# Patient Record
Sex: Female | Born: 1995
Health system: Southern US, Community
[De-identification: ages and names within clinical notes are randomized; demographics above are authoritative.]

## PROBLEM LIST (undated history)

## (undated) DIAGNOSIS — F32A Depression, unspecified: Secondary | ICD-10-CM

## (undated) DIAGNOSIS — F329 Major depressive disorder, single episode, unspecified: Secondary | ICD-10-CM

---
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
## 2017-06-26 DIAGNOSIS — Z3046 Encounter for surveillance of implantable subdermal contraceptive: Secondary | ICD-10-CM | POA: Diagnosis not present

## 2017-06-26 DIAGNOSIS — Z308 Encounter for other contraceptive management: Secondary | ICD-10-CM | POA: Diagnosis not present

## 2018-06-17 ENCOUNTER — Other Ambulatory Visit: Payer: Self-pay

## 2018-06-17 ENCOUNTER — Ambulatory Visit: Payer: 59 | Admitting: Family Medicine

## 2018-06-17 ENCOUNTER — Encounter: Payer: Self-pay | Admitting: Family Medicine

## 2018-06-17 VITALS — BP 115/71 | HR 57 | Temp 98.5°F | Ht 66.0 in | Wt 177.0 lb

## 2018-06-17 DIAGNOSIS — F419 Anxiety disorder, unspecified: Secondary | ICD-10-CM | POA: Diagnosis not present

## 2018-06-17 DIAGNOSIS — Z7689 Persons encountering health services in other specified circumstances: Secondary | ICD-10-CM | POA: Diagnosis not present

## 2018-06-17 DIAGNOSIS — F339 Major depressive disorder, recurrent, unspecified: Secondary | ICD-10-CM | POA: Diagnosis not present

## 2018-06-17 MED ORDER — VENLAFAXINE HCL ER 37.5 MG PO CP24: 37.5000 mg | ORAL_CAPSULE | Freq: Every day | ORAL | 0 refills | Status: DC

## 2018-06-17 NOTE — Assessment & Plan Note (Signed)
Will start effexor and follow up in 2 weeks. Re-establish with counseling.

## 2018-06-17 NOTE — Assessment & Plan Note (Signed)
Possible bipolar component, will start effexor and add mood stabilizer in 2 weeks if tolerating well. Pt plans to reach back out to [facility] to re-establish with counseling. Resources given for walk in Psychiatric clinics and ER if sxs worsen significantly.

## 2018-06-17 NOTE — Patient Instructions (Signed)
Venlafaxine extended-release capsules  What is this medicine?  VENLAFAXINE(VEN la fax een) is used to treat depression, anxiety and panic disorder.  This medicine may be used for other purposes; ask your health care provider or pharmacist if you have questions.  COMMON BRAND NAME(S): Effexor XR  What should I tell my health care provider before I take this medicine?  They need to know if you have any of these conditions:  -bleeding disorders  -glaucoma  -heart disease  -high blood pressure  -high cholesterol  -kidney disease  -liver disease  -low levels of sodium in the blood  -mania or bipolar disorder  -seizures  -suicidal thoughts, plans, or attempt; a previous suicide attempt by you or a family  -take medicines that treat or prevent blood clots  -thyroid disease  -an unusual or allergic reaction to venlafaxine, desvenlafaxine, other medicines, foods, dyes, or preservatives  -pregnant or trying to get pregnant  -breast-feeding  How should I use this medicine?  Take this medicine by mouth with a full glass of water. Follow the directions on the prescription label. Do not cut, crush, or chew this medicine. Take it with food. If needed, the capsule may be carefully opened and the entire contents sprinkled on a spoonful of cool applesauce. Swallow the applesauce/pellet mixture right away without chewing and follow with a glass of water to ensure complete swallowing of the pellets. Try to take your medicine at about the same time each day. Do not take your medicine more often than directed. Do not stop taking this medicine suddenly except upon the advice of your doctor. Stopping this medicine too quickly may cause serious side effects or your condition may worsen.  A special MedGuide will be given to you by the pharmacist with each prescription and refill. Be sure to read this information carefully each time.  Talk to your pediatrician regarding the use of this medicine in children. Special care may be  needed.  Overdosage: If you think you have taken too much of this medicine contact a poison control center or emergency room at once.  NOTE: This medicine is only for you. Do not share this medicine with others.  What if I miss a dose?  If you miss a dose, take it as soon as you can. If it is almost time for your next dose, take only that dose. Do not take double or extra doses.  What may interact with this medicine?  Do not take this medicine with any of the following medications:  -certain medicines for fungal infections like fluconazole, itraconazole, ketoconazole, posaconazole, voriconazole  -cisapride  -desvenlafaxine  -dofetilide  -dronedarone  -duloxetine  -levomilnacipran  -linezolid  -MAOIs like Carbex, Eldepryl, Marplan, Nardil, and Parnate  -methylene blue (injected into a vein)  -milnacipran  -pimozide  -thioridazine  -ziprasidone  This medicine may also interact with the following medications:  -amphetamines  -aspirin and aspirin-like medicines  -certain medicines for depression, anxiety, or psychotic disturbances  -certain medicines for migraine headaches like almotriptan, eletriptan, frovatriptan, naratriptan, rizatriptan, sumatriptan, zolmitriptan  -certain medicines for sleep  -certain medicines that treat or prevent blood clots like dalteparin, enoxaparin, warfarin  -cimetidine  -clozapine  -diuretics  -fentanyl  -furazolidone  -indinavir  -isoniazid  -lithium  -metoprolol  -NSAIDS, medicines for pain and inflammation, like ibuprofen or naproxen  -other medicines that prolong the QT interval (cause an abnormal heart rhythm)  -procarbazine  -rasagiline  -supplements like St. John's wort, kava kava, valerian  -tramadol  -tryptophan    This list may not describe all possible interactions. Give your health care provider a list of all the medicines, herbs, non-prescription drugs, or dietary supplements you use. Also tell them if you smoke, drink alcohol, or use illegal drugs. Some items may interact with  your medicine.  What should I watch for while using this medicine?  Tell your doctor if your symptoms do not get better or if they get worse. Visit your doctor or health care professional for regular checks on your progress. Because it may take several weeks to see the full effects of this medicine, it is important to continue your treatment as prescribed by your doctor.  Patients and their families should watch out for new or worsening thoughts of suicide or depression. Also watch out for sudden changes in feelings such as feeling anxious, agitated, panicky, irritable, hostile, aggressive, impulsive, severely restless, overly excited and hyperactive, or not being able to sleep. If this happens, especially at the beginning of treatment or after a change in dose, call your health care professional.  This medicine can cause an increase in blood pressure. Check with your doctor for instructions on monitoring your blood pressure while taking this medicine.  You may get drowsy or dizzy. Do not drive, use machinery, or do anything that needs mental alertness until you know how this medicine affects you. Do not stand or sit up quickly, especially if you are an older patient. This reduces the risk of dizzy or fainting spells. Alcohol may interfere with the effect of this medicine. Avoid alcoholic drinks.  Your mouth may get dry. Chewing sugarless gum, sucking hard candy and drinking plenty of water will help. Contact your doctor if the problem does not go away or is severe.  What side effects may I notice from receiving this medicine?  Side effects that you should report to your doctor or health care professional as soon as possible:  -allergic reactions like skin rash, itching or hives, swelling of the face, lips, or tongue  -anxious  -breathing problems  -confusion  -changes in vision  -chest pain  -confusion  -elevated mood, decreased need for sleep, racing thoughts, impulsive behavior  -eye pain  -fast, irregular  heartbeat  -feeling faint or lightheaded, falls  -feeling agitated, angry, or irritable  -hallucination, loss of contact with reality  -high blood pressure  -loss of balance or coordination  -palpitations  -redness, blistering, peeling or loosening of the skin, including inside the mouth  -restlessness, pacing, inability to keep still  -seizures  -stiff muscles  -suicidal thoughts or other mood changes  -trouble passing urine or change in the amount of urine  -trouble sleeping  -unusual bleeding or bruising  -unusually weak or tired  -vomiting  Side effects that usually do not require medical attention (report to your doctor or health care professional if they continue or are bothersome):  -change in sex drive or performance  -change in appetite or weight  -constipation  -dizziness  -dry mouth  -headache  -increased sweating  -nausea  -tired  This list may not describe all possible side effects. Call your doctor for medical advice about side effects. You may report side effects to FDA at 1-800-FDA-1088.  Where should I keep my medicine?  Keep out of the reach of children.  Store at a controlled temperature between 20 and 25 degrees C (68 degrees and 77 degrees F), in a dry place. Throw away any unused medicine after the expiration date.  NOTE: This sheet is a   summary. It may not cover all possible information. If you have questions about this medicine, talk to your doctor, pharmacist, or health care provider.   2019 Elsevier/Gold Standard (2015-08-24 18:38:02)

## 2018-06-17 NOTE — Progress Notes (Signed)
   BP 115/71   Pulse (!) 57   Temp 98.5 F (36.9 C) (Oral)   Ht 5\' 6"  (1.676 m)   Wt 177 lb (80.3 kg)   SpO2 98%   BMI 28.57 kg/m    Subjective:    Patient ID: Brittany Vega, female    DOB: 1995-10-30, 22 y.o.   MRN: [id number]  HPI: Brittany Vega is a 23 y.o. female  Chief Complaint  Patient presents with  . New Patient (Initial Visit)    Preferred Primary Care   Here today to establish care. Has not had a CPE in over a year. Currently has nexplanon for contraception.   Feels very overwhelmed, anxious since about age 67. Has been to [facility] for counseling twice which didn't seem to be a good fit for her. Thinks maybe some childhood issues are at the root but nothing in particular. Symptoms are every day and she doesn't necessarily notice triggers. Sweats, hot, chest tightness, nausea, mind racing and can happen at any time. Struggles to fall asleep and stay asleep and then oversleeps. Also dealing with depressed moods frequently but not all the time. Has really high highs and low lows frequently. Fhx of depression and anxiety. Denies SI/HI.   No other known medical issues or concerns today.   No flowsheet data found. No flowsheet data found.     Relevant past medical, surgical, family and social history reviewed and updated as indicated. Interim medical history since our last visit reviewed. Allergies and medications reviewed and updated.  Review of Systems  Per HPI unless specifically indicated above     Objective:    BP 115/71   Pulse (!) 57   Temp 98.5 F (36.9 C) (Oral)   Ht 5\' 6"  (1.676 m)   Wt 177 lb (80.3 kg)   SpO2 98%   BMI 28.57 kg/m   Wt Readings from Last 3 Encounters:  06/17/18 177 lb (80.3 kg)  12/05/13 135 lb (61.2 kg) (74 %, Z= 0.64)*   * Growth percentiles are based on CDC (Girls, 2-20 Years) data.    Physical Exam  No results found for this or any previous visit.    Assessment & Plan:   Problem List Items Addressed This Visit      Other   Anxiety - Primary    Will start effexor and follow up in 2 weeks. Re-establish with counseling.       Relevant Medications   venlafaxine XR (EFFEXOR XR) 37.5 MG 24 hr capsule   Depression, recurrent (HCC)    Possible bipolar component, will start effexor and add mood stabilizer in 2 weeks if tolerating well. Pt plans to reach back out to [facility] to re-establish with counseling. Resources given for walk in Psychiatric clinics and ER if sxs worsen significantly.       Relevant Medications   venlafaxine XR (EFFEXOR XR) 37.5 MG 24 hr capsule    Other Visit Diagnoses    Encounter to establish care           Follow up plan: Return in about 2 weeks (around 07/01/2018) for CPE - Mood, anxiety f/u.

## 2018-07-02 ENCOUNTER — Encounter: Payer: 59 | Admitting: Family Medicine

## 2018-07-17 ENCOUNTER — Other Ambulatory Visit: Payer: Self-pay | Admitting: Family Medicine

## 2018-07-17 NOTE — Telephone Encounter (Signed)
Pt is several weeks overdue for f/u, I will send in 2 week supply but please schedule her for a f/u ASAP

## 2018-07-17 NOTE — Telephone Encounter (Signed)
Requested medication (s) are due for refill today:yes  Requested medication (s) are on the active medication list: yes  Last refill: 06/17/2018  Future visit scheduled: no  Notes to clinic:  Over due labs    Requested Prescriptions  Pending Prescriptions Disp Refills   venlafaxine XR (EFFEXOR-XR) 37.5 MG 24 hr capsule [Pharmacy Med Name: VENLAFAXINE HCL ER 37.5 MG CAP] 30 capsule 0    Sig: TAKE 1 CAPSULE BY MOUTH EVERY DAY WITH BREAKFAST     Psychiatry: Antidepressants - SNRI - desvenlafaxine & venlafaxine Failed - 07/17/2018  1:48 PM      Failed - LDL in normal range and within 360 days    No results found for: LDLCALC, LDLC, HIRISKLDL       Failed - Total Cholesterol in normal range and within 360 days    No results found for: CHOL, POCCHOL       Failed - Triglycerides in normal range and within 360 days    No results found for: TRIG       Failed - Completed PHQ-2 or PHQ-9 in the last 360 days.      Passed - Last BP in normal range    BP Readings from Last 1 Encounters:  06/17/18 115/71         Passed - Valid encounter within last 6 months    Recent Outpatient Visits          1 month ago Anxiety   [facility] Roosvelt Maser [place], [place]

## 2018-07-17 NOTE — Telephone Encounter (Signed)
Called no answer left voicemail

## 2018-07-28 ENCOUNTER — Ambulatory Visit (INDEPENDENT_AMBULATORY_CARE_PROVIDER_SITE_OTHER): Payer: 59 | Admitting: Family Medicine

## 2018-07-28 ENCOUNTER — Other Ambulatory Visit: Payer: Self-pay

## 2018-07-28 ENCOUNTER — Encounter: Payer: Self-pay | Admitting: Family Medicine

## 2018-07-28 DIAGNOSIS — F339 Major depressive disorder, recurrent, unspecified: Secondary | ICD-10-CM

## 2018-07-28 DIAGNOSIS — F419 Anxiety disorder, unspecified: Secondary | ICD-10-CM

## 2018-07-28 MED ORDER — VENLAFAXINE HCL ER 37.5 MG PO CP24: 37.5000 mg | ORAL_CAPSULE | Freq: Every day | ORAL | 0 refills | Status: DC

## 2018-07-28 NOTE — Assessment & Plan Note (Signed)
Significant improvement with low dose effexor XR. Continue current regimen. Recheck in 3 months

## 2018-07-28 NOTE — Progress Notes (Signed)
There were no vitals taken for this visit.   Subjective:    Patient ID: Brittany Vega, female    DOB: 23-Jun-1995, 23 y.o.   MRN: [id number]  HPI: Brittany Vega is a 23 y.o. female  Chief Complaint  Patient presents with  . Follow-up  . Anxiety  . Depression    No side effects to medication. Is helping.     . This visit was completed via WebEx due to the restrictions of the COVID-19 pandemic. All issues as above were discussed and addressed. Physical exam was done as above through visual confirmation on WebEx. If it was felt that the patient should be evaluated in the office, they were directed there. The patient verbally consented to this visit. . Location of the patient: home . Location of the provider: home . Those involved with this call:  . Provider: Roosvelt Maserachel Shakema Surita, PA-C . CMA: Sheilah MinsJada Fox, CMA . Front Desk/Registration: Harriet PhoJoliza Johnson  . Time spent on call: 15 minutes with patient face to face via video conference. More than 50% of this time was spent in counseling and coordination of care. 5 minutes total spent in review of patient's record and preparation of their chart.  I verified patient identity using two factors (patient name and date of birth). Patient consents verbally to being seen via telemedicine visit today.   Pt here today for 1 month f/u mood and anxiety after starting low dose effexor. Notes significant benefit in mood stability and anxiety on the medicine. Minimal low moods or panic episodes since starting on it. Denies SI/HI. Sleeping better and not noticing any side effects. No new concerns today.   [facility] 07[facility]  Decreased Interest 0  Down, Depressed, Hopeless 0  PHQ - 2 Score 0  Altered sleeping 1  Tired, decreased energy 1  Change in appetite 0  Feeling bad or failure about yourself  0  Trouble concentrating 1  Moving slowly or fidgety/restless 0  Suicidal thoughts 0  PHQ-9 Score 3  Difficult doing work/chores Not difficult at  all   GAD 7 : Generalized Anxiety Score 07/28/2018  Nervous, Anxious, on Edge 1  Control/stop worrying 1  Worry too much - different things 1  Trouble relaxing 0  Restless 0  Easily annoyed or irritable 0  Afraid - awful might happen 0  Total GAD 7 Score 3  Anxiety Difficulty Not difficult at all   Relevant past medical, surgical, family and social history reviewed and updated as indicated. Interim medical history since our last visit reviewed. Allergies and medications reviewed and updated.  Review of Systems  Per HPI unless specifically indicated above     Objective:    There were no vitals taken for this visit.  Wt Readings from Last 3 Encounters:  06/17/18 177 lb (80.3 kg)  12/05/13 135 lb (61.2 kg) (74 %, Z= 0.64)*   * Growth percentiles are based on CDC (Girls, 2-20 Years) data.    Physical Exam Vitals signs and nursing note reviewed.  Constitutional:      General: She is not in acute distress.    Appearance: Normal appearance.  HENT:     Head: Atraumatic.     Right Ear: External ear normal.     Left Ear: External ear normal.     Mouth/Throat:     Mouth: Mucous membranes are moist.  Eyes:     Extraocular Movements: Extraocular movements intact.     Conjunctiva/sclera: Conjunctivae normal.  Neck:  Musculoskeletal: Normal range of motion.  Cardiovascular:     Comments: Unable to assess via virtual visit Pulmonary:     Effort: Pulmonary effort is normal. No respiratory distress.  Musculoskeletal: Normal range of motion.  Skin:    General: Skin is dry.     Findings: No erythema.  Neurological:     Mental Status: She is alert and oriented to person, place, and time.  Psychiatric:        Mood and Affect: Mood normal.        Thought Content: Thought content normal.        Judgment: Judgment normal.     No results found for this or any previous visit.    Assessment & Plan:   Problem List Items Addressed This Visit      Other   Anxiety     Significant improvement with low dose effexor XR. Continue current regimen. Recheck in 3 months      Relevant Medications   venlafaxine XR (EFFEXOR-XR) 37.5 MG 24 hr capsule   Depression, recurrent (HCC) - Primary    Significant improvement with low dose effexor XR. Continue current regimen. Recheck in 3 months      Relevant Medications   venlafaxine XR (EFFEXOR-XR) 37.5 MG 24 hr capsule       Follow up plan: Return in about 3 months (around 10/27/2018) for Mood f/u.

## 2018-07-28 NOTE — Assessment & Plan Note (Signed)
Significant improvement with low dose effexor XR. Continue current regimen. Recheck in 3 months 

## 2018-08-06 ENCOUNTER — Ambulatory Visit (INDEPENDENT_AMBULATORY_CARE_PROVIDER_SITE_OTHER): Payer: 59 | Admitting: Family Medicine

## 2018-08-06 ENCOUNTER — Other Ambulatory Visit: Payer: Self-pay

## 2018-08-06 ENCOUNTER — Encounter: Payer: Self-pay | Admitting: Family Medicine

## 2018-08-06 VITALS — Ht 66.0 in

## 2018-08-06 DIAGNOSIS — L509 Urticaria, unspecified: Secondary | ICD-10-CM

## 2018-08-06 MED ORDER — TRIAMCINOLONE ACETONIDE 0.1 % EX CREA: 1.0000 "application " | TOPICAL_CREAM | Freq: Two times a day (BID) | CUTANEOUS | 1 refills | Status: DC

## 2018-08-06 NOTE — Progress Notes (Signed)
Ht 5\' 6"  (1.676 m)   BMI 28.57 kg/m    Subjective:    Patient ID: Brittany Vega Raptis, female    DOB: 08/23/1995, 23 y.o.   MRN: [id number]  HPI: Brittany Vega Cavallero is a 23 y.o. female  Chief Complaint  Patient presents with  . Rash    left arm, chest, small spots on bilateral calf. x 3 weeks. red and itching. tried OTC cortisone    . This visit was completed via WebEx due to the restrictions of the COVID-19 pandemic. All issues as above were discussed and addressed. Physical exam was done as above through visual confirmation on WebEx. If it was felt that the patient should be evaluated in the office, they were directed there. The patient verbally consented to this visit. . Location of the patient: home . Location of the provider: work . Those involved with this call:  . Provider: Roosvelt Maserachel Lane, PA-C . CMA: Elton SinAnita Quito, CMA . Front Desk/Registration: Harriet PhoJoliza Johnson  . Time spent on call: 15 minutes with patient face to face via video conference. More than 50% of this time was spent in counseling and coordination of care. 5 minutes total spent in review of patient's record and preparation of their chart. I verified patient identity using two factors (patient name and date of birth). Patient consents verbally to being seen via telemedicine visit today.   Patient c/o 3 weeks of itching intermittent rash. Started on backs of thighs and has now spread several places, including left forearm and abdomen. Seems to come and go. Very itchy, does not burn. States this started about 6 years ago and just comes up occasionally. Has not had a flare in about a year. No new medicines, foods, recent travel, yardwork the past week or so. Does note her sister had a small spot on her that she thought was poison ivy but they weren't sure. Trying alcohol wipes, calamine lotion, and cortisone cream with minimal relief. Tried benadryl initially without much relief.   Relevant past medical, surgical, family and social  history reviewed and updated as indicated. Interim medical history since our last visit reviewed. Allergies and medications reviewed and updated.  Review of Systems  Per HPI unless specifically indicated above     Objective:    Ht 5\' 6"  (1.676 m)   BMI 28.57 kg/m   Wt Readings from Last 3 Encounters:  06/17/18 177 lb (80.3 kg)  12/05/13 135 lb (61.2 kg) (74 %, Z= 0.64)*   * Growth percentiles are based on CDC (Girls, 2-20 Years) data.    Physical Exam Vitals signs and nursing note reviewed.  Constitutional:      General: She is not in acute distress.    Appearance: Normal appearance.  HENT:     Head: Atraumatic.     Right Ear: External ear normal.     Left Ear: External ear normal.     Nose: Nose normal. No congestion.     Mouth/Throat:     Mouth: Mucous membranes are moist.     Pharynx: Oropharynx is clear. No posterior oropharyngeal erythema.  Eyes:     Extraocular Movements: Extraocular movements intact.     Conjunctiva/sclera: Conjunctivae normal.  Neck:     Musculoskeletal: Normal range of motion.  Cardiovascular:     Comments: Unable to assess via virtual visit Pulmonary:     Effort: Pulmonary effort is normal. No respiratory distress.  Musculoskeletal: Normal range of motion.  Skin:    General: Skin is  warm and dry.     Findings: No erythema.     Comments: Sporadic erythematous maculopapular areas on left forearm and darkened area from previous inflammatory rash on posterior upper legs  Neurological:     Mental Status: She is alert and oriented to person, place, and time.  Psychiatric:        Mood and Affect: Mood normal.        Thought Content: Thought content normal.        Judgment: Judgment normal.     No results found for this or any previous visit.    Assessment & Plan:   Problem List Items Addressed This Visit    None    Visit Diagnoses    Hives    -  Primary   Tx with antihistamines BID, triamcinolone cream. If worsening or not improving,  add prednisone taper. Unclear trigger at this time       Follow up plan: Return for as scheduled.

## 2018-08-16 ENCOUNTER — Encounter (HOSPITAL_COMMUNITY): Payer: Self-pay | Admitting: Emergency Medicine

## 2018-08-16 ENCOUNTER — Emergency Department (HOSPITAL_COMMUNITY): Payer: 59

## 2018-08-16 ENCOUNTER — Other Ambulatory Visit: Payer: Self-pay

## 2018-08-16 ENCOUNTER — Emergency Department (HOSPITAL_COMMUNITY)
Admission: EM | Admit: 2018-08-16 | Discharge: 2018-08-16 | Disposition: A | Discharge status: Home / Self Care | Payer: 59 | Attending: Emergency Medicine | Admitting: Emergency Medicine

## 2018-08-16 DIAGNOSIS — X501XXA Overexertion from prolonged static or awkward postures, initial encounter: Secondary | ICD-10-CM | POA: Insufficient documentation

## 2018-08-16 DIAGNOSIS — Z79899 Other long term (current) drug therapy: Secondary | ICD-10-CM | POA: Diagnosis not present

## 2018-08-16 DIAGNOSIS — Y999 Unspecified external cause status: Secondary | ICD-10-CM | POA: Diagnosis not present

## 2018-08-16 DIAGNOSIS — S99921A Unspecified injury of right foot, initial encounter: Secondary | ICD-10-CM | POA: Diagnosis present

## 2018-08-16 DIAGNOSIS — S92354A Nondisplaced fracture of fifth metatarsal bone, right foot, initial encounter for closed fracture: Secondary | ICD-10-CM | POA: Diagnosis not present

## 2018-08-16 DIAGNOSIS — Y929 Unspecified place or not applicable: Secondary | ICD-10-CM | POA: Insufficient documentation

## 2018-08-16 DIAGNOSIS — Y9389 Activity, other specified: Secondary | ICD-10-CM | POA: Diagnosis not present

## 2018-08-16 HISTORY — DX: Depression, unspecified: F32.A

## 2018-08-16 MED ORDER — NAPROXEN 250 MG PO TABS
500.0000 mg | ORAL_TABLET | Freq: Once | ORAL | Status: AC
  Administered 2018-08-16: 500 mg via ORAL
  Filled 2018-08-16: qty 2

## 2018-08-16 NOTE — ED Notes (Signed)
Patient transported to X-ray 

## 2018-08-16 NOTE — ED Notes (Signed)
Discharge instructions and follow up care discussed with patient and mother at bedside. Pt. Has no questions at this time.

## 2018-08-16 NOTE — Discharge Instructions (Addendum)
You may alternate ibuprofen or Tylenol for your pain, please keep your leg elevated while at home along with applying ice to the area.  I have also provided crutches a you may use to keep your weight off of your right foot.  Please follow-up with orthopedics, Dr. Aundria Rud at your earliest convenience.

## 2018-08-16 NOTE — ED Provider Notes (Signed)
[facility]   CSN: [id number] Arrival date & time: 08/16/18  2059    History   Chief Complaint Chief Complaint  Patient presents with  . Foot Pain    HPI Brittany Vega is a 23 y.o. female.     23 y.o female with a PMH of depression presents to the ED with a chief complaint of right foot pain.  Patient reports she was walking downhill with platform shoes when her shoe inverted over, she reports falling.  Patient is experiencing pain along the lateral aspect of her foot, reports she was able to ambulate after the accident happened, however now there is been increasing in swelling along the lateral region.  She reports no medical therapy or applying ice to the area prior to arrival.  Patient denies any other injury.     Past Medical History:  Diagnosis Date  . Depression     Patient Active Problem List   Diagnosis Date Noted  . Anxiety 06/17/2018  . Depression, recurrent (HCC) 06/17/2018    History reviewed. No pertinent surgical history.   OB History   No obstetric history on file.      Home Medications    Prior to Admission medications   Medication Sig Start Date End Date Taking? Authorizing Provider  etonogestrel (NEXPLANON) 68 MG IMPL implant 1 each by Subdermal route once. Placed April 2019    [provider]  triamcinolone cream (KENALOG) 0.1 % Apply 1 application topically 2 (two) times daily. 08/06/18   Particia NearingLane, Rachel Elizabeth, PA-C  venlafaxine XR (EFFEXOR-XR) 37.5 MG 24 hr capsule Take 1 capsule (37.5 mg total) by mouth daily. 07/28/18   Particia NearingLane, Rachel Elizabeth, PA-C    Family History Family History  Problem Relation Age of Onset  . Congestive Heart Failure Maternal Grandmother   . COPD Maternal Grandmother   . Fibromyalgia Maternal Grandmother     Social History Social History   Tobacco Use  . Smoking status: Never Smoker  . Smokeless tobacco: Never Used  Substance Use Topics  .  Alcohol use: No  . Drug use: No     Allergies   Patient has no known allergies.   Review of Systems Review of Systems  Constitutional: Negative for fever.  Musculoskeletal: Positive for arthralgias.     Physical Exam Updated Vital Signs BP 121/74   Pulse 72   Temp 98.7 F (37.1 C) (Oral)   Resp 17   Ht 5\' 6"  (1.676 m)   Wt 79.4 kg   SpO2 100%   BMI 28.25 kg/m   Physical Exam Vitals signs and nursing note reviewed.      ED Treatments / Results  Labs (all labs ordered are listed, but only abnormal results are displayed) Labs Reviewed - No data to display  EKG None  Radiology Dg Ankle 2 Views Right  Result Date: 08/16/2018 CLINICAL DATA:  Twisting injury EXAM: RIGHT ANKLE - 2 VIEW; RIGHT FOOT - 2 VIEW COMPARISON:  None. FINDINGS: There is a subtle, nondisplaced transverse fracture of the base of the right fifth metatarsal. No other fracture or dislocation of the right foot or ankle. Joint spaces are preserved. Soft tissue edema about the lateral foot. IMPRESSION: There is a subtle, nondisplaced transverse fracture of the base of the right fifth metatarsal. No other fracture or dislocation of the right foot or ankle. Joint spaces are preserved. Soft tissue edema about the lateral foot. Electronically Signed   By: Lauralyn PrimesAlex  Bibbey  M.D.   On: 08/16/2018 21:42   Dg Foot 2 Views Right  Result Date: 08/16/2018 CLINICAL DATA:  Twisting injury EXAM: RIGHT ANKLE - 2 VIEW; RIGHT FOOT - 2 VIEW COMPARISON:  None. FINDINGS: There is a subtle, nondisplaced transverse fracture of the base of the right fifth metatarsal. No other fracture or dislocation of the right foot or ankle. Joint spaces are preserved. Soft tissue edema about the lateral foot. IMPRESSION: There is a subtle, nondisplaced transverse fracture of the base of the right fifth metatarsal. No other fracture or dislocation of the right foot or ankle. Joint spaces are preserved. Soft tissue edema about the lateral foot.  Electronically Signed   By: Lauralyn Primes M.D.   On: 08/16/2018 21:42    Procedures Procedures (including critical care time)  Medications Ordered in ED Medications  naproxen (NAPROSYN) tablet 500 mg (500 mg Oral Given 08/16/18 2155)     Initial Impression / Assessment and Plan / ED Course  I have reviewed the triage vital signs and the nursing notes.  Pertinent labs & imaging results that were available during my care of the patient were reviewed by me and considered in my medical decision making (see chart for details).       Patient with no past medical history presents to the ED status post fall this evening.  Reports pain along the lateral aspect of her right foot, reports ambulating after the incident but noticing increasing swelling.  There is tenderness to palpation along the lateral region.  During evaluation patient is neurovascularly intact, is able to plantarflex along with dorsiflex.  No abrasion noted, there is swelling to the lateral aspect of the foot.  X-rays of the ankle and right foot have been obtained which showed: There is a subtle, nondisplaced transverse fracture of the base of  the right fifth metatarsal. No other fracture or dislocation of the  right foot or ankle. Joint spaces are preserved. Soft tissue edema  about the lateral foot.        These results have been discussed with patient, I will place patient on a postop shoe along with provide her with crutches and orthopedic referral.  Rice therapy is also recommended for patient.  She received naproxen while in the ED.  Patient will be discharged with postop shoe along with crutches to help with ambulation.  Patient understands and agrees with management, with stable vital signs return precautions provided at length.  Portions of this note were generated with Scientist, clinical (histocompatibility and immunogenetics). Dictation errors may occur despite best attempts at proofreading.     Final Clinical Impressions(s) / ED Diagnoses    Final diagnoses:  Nondisplaced fracture of fifth metatarsal bone, right foot, initial encounter for closed fracture    ED Discharge Orders    None       Claude Manges, Cordelia Poche 08/16/18 2203    Tegeler, Canary Brim, MD 08/17/18 [phone]

## 2018-08-16 NOTE — ED Triage Notes (Signed)
Reports twisting right ankle while wearing platform shoes.  Now has pain and swelling to outer right foot.

## 2018-08-26 DIAGNOSIS — S92354A Nondisplaced fracture of fifth metatarsal bone, right foot, initial encounter for closed fracture: Secondary | ICD-10-CM | POA: Diagnosis not present

## 2018-08-26 DIAGNOSIS — M79671 Pain in right foot: Secondary | ICD-10-CM | POA: Diagnosis not present

## 2018-10-30 ENCOUNTER — Encounter: Payer: Self-pay | Admitting: Family Medicine

## 2018-10-30 ENCOUNTER — Other Ambulatory Visit: Payer: Self-pay

## 2018-10-30 ENCOUNTER — Ambulatory Visit (INDEPENDENT_AMBULATORY_CARE_PROVIDER_SITE_OTHER): Payer: 59 | Admitting: Family Medicine

## 2018-10-30 VITALS — BP 120/78 | HR 84 | Temp 98.4°F | Ht 66.0 in | Wt 185.0 lb

## 2018-10-30 DIAGNOSIS — F339 Major depressive disorder, recurrent, unspecified: Secondary | ICD-10-CM | POA: Diagnosis not present

## 2018-10-30 DIAGNOSIS — R300 Dysuria: Secondary | ICD-10-CM

## 2018-10-30 DIAGNOSIS — F419 Anxiety disorder, unspecified: Secondary | ICD-10-CM

## 2018-10-30 LAB — UA/M W/RFLX CULTURE, ROUTINE
Bilirubin, UA: NEGATIVE
Glucose, UA: NEGATIVE
Ketones, UA: NEGATIVE
Leukocytes,UA: NEGATIVE
Nitrite, UA: NEGATIVE
Protein,UA: NEGATIVE
Specific Gravity, UA: 1.02 (ref 1.005–1.030)
Urobilinogen, Ur: 1 mg/dL (ref 0.2–1.0)
pH, UA: 8.5 — ABNORMAL HIGH (ref 5.0–7.5)

## 2018-10-30 LAB — MICROSCOPIC EXAMINATION

## 2018-10-30 MED ORDER — VENLAFAXINE HCL ER 75 MG PO CP24: 75.0000 mg | ORAL_CAPSULE | Freq: Every day | ORAL | 0 refills | Status: DC

## 2018-10-30 NOTE — Progress Notes (Signed)
BP 120/78   Pulse 84   Temp 98.4 F (36.9 C) (Oral)   Ht 5\' 6"  (1.676 m)   Wt 185 lb (83.9 kg)   SpO2 99%   BMI 29.86 kg/m    Subjective:    Patient ID: Brittany HerterHayley B Lacey, female    DOB: Oct 28, 1995, 23 y.o.   MRN: [id number]  HPI: Brittany Vega is a 23 y.o. female  Chief Complaint  Patient presents with  . Anxiety    f/u  . Depression   Patient presenting today for anxiety and depression f/u. Currently on low dose effexor x 4 months, feels it's helping quite a bit and no longer having all the panic attacks or low moods but does still have a fair amount of anxiety. Denies side effects, SI/HI.   6 days of dysuria, drinking tons of water which has helped. Hx of UTIs. Denies fevers, chills, abdominal pain, hematuria, flank pain, concern for STIs.   Relevant past medical, surgical, family and social history reviewed and updated as indicated. Interim medical history since our last visit reviewed. Allergies and medications reviewed and updated.  Review of Systems  Per HPI unless specifically indicated above     Objective:    BP 120/78   Pulse 84   Temp 98.4 F (36.9 C) (Oral)   Ht 5\' 6"  (1.676 m)   Wt 185 lb (83.9 kg)   SpO2 99%   BMI 29.86 kg/m   Wt Readings from Last 3 Encounters:  10/30/18 185 lb (83.9 kg)  08/16/18 175 lb (79.4 kg)  06/17/18 177 lb (80.3 kg)    Physical Exam Vitals signs and nursing note reviewed.  Constitutional:      Appearance: Normal appearance. She is not ill-appearing.  HENT:     Head: Atraumatic.  Eyes:     Extraocular Movements: Extraocular movements intact.     Conjunctiva/sclera: Conjunctivae normal.  Neck:     Musculoskeletal: Normal range of motion and neck supple.  Cardiovascular:     Rate and Rhythm: Normal rate and regular rhythm.     Heart sounds: Normal heart sounds.  Pulmonary:     Effort: Pulmonary effort is normal.     Breath sounds: Normal breath sounds.  Abdominal:     General: Bowel sounds are normal.   Palpations: Abdomen is soft.     Tenderness: There is no abdominal tenderness. There is no right CVA tenderness, left CVA tenderness or guarding.  Musculoskeletal: Normal range of motion.  Skin:    General: Skin is warm and dry.  Neurological:     Mental Status: She is alert and oriented to person, place, and time.  Psychiatric:        Mood and Affect: Mood normal.        Thought Content: Thought content normal.        Judgment: Judgment normal.     Results for orders placed or performed in visit on 10/30/18  Microscopic Examination   URINE  Result Value Ref Range   WBC, UA 0-5 0 - 5 /hpf   RBC 0-2 0 - 2 /hpf   Epithelial Cells (non renal) 0-10 0 - 10 /hpf   Bacteria, UA Few (A) None seen/Few  UA/M w/rflx Culture, Routine   Specimen: Urine   URINE  Result Value Ref Range   Specific Gravity, UA 1.020 1.005 - 1.030   pH, UA 8.5 (H) 5.0 - 7.5   Color, UA Yellow Yellow   Appearance Ur Clear Clear  Leukocytes,UA Negative Negative   Protein,UA Negative Negative/Trace   Glucose, UA Negative Negative   Ketones, UA Negative Negative   RBC, UA 3+ (A) Negative   Bilirubin, UA Negative Negative   Urobilinogen, Ur 1.0 0.2 - 1.0 mg/dL   Nitrite, UA Negative Negative   Microscopic Examination See below:       Assessment & Plan:   Problem List Items Addressed This Visit      Other   Anxiety - Primary    Increase effexor to 75 mg XR dose and monitor for benefit. Continue working on breathing techniques, exercise      Relevant Medications   venlafaxine XR (EFFEXOR XR) 75 MG 24 hr capsule   Depression, recurrent (HCC)    Improved on effexor, still with some breakthrough sxs. Increase dose to effexor 75 XR and monitor for benefit.       Relevant Medications   venlafaxine XR (EFFEXOR XR) 75 MG 24 hr capsule    Other Visit Diagnoses    Dysuria       U/A without evidence of UTI, continue drinking plenty of fluids and f/u if sxs worsening or not improving   Relevant Orders    UA/M w/rflx Culture, Routine (Completed)       Follow up plan: Return in about 6 months (around 05/02/2019) for Mood f/u.

## 2018-11-02 NOTE — Assessment & Plan Note (Signed)
Improved on effexor, still with some breakthrough sxs. Increase dose to effexor 75 XR and monitor for benefit.

## 2018-11-02 NOTE — Assessment & Plan Note (Signed)
Increase effexor to 75 mg XR dose and monitor for benefit. Continue working on breathing techniques, exercise

## 2018-11-04 ENCOUNTER — Encounter: Payer: Self-pay | Admitting: Family Medicine

## 2018-11-05 ENCOUNTER — Other Ambulatory Visit: Payer: Self-pay | Admitting: Family Medicine

## 2018-11-05 DIAGNOSIS — R300 Dysuria: Secondary | ICD-10-CM

## 2018-11-06 ENCOUNTER — Other Ambulatory Visit: Payer: Self-pay

## 2018-11-06 ENCOUNTER — Other Ambulatory Visit: Payer: 59

## 2018-11-06 DIAGNOSIS — R300 Dysuria: Secondary | ICD-10-CM

## 2018-11-08 LAB — UA/M W/RFLX CULTURE, ROUTINE
Bilirubin, UA: NEGATIVE
Glucose, UA: NEGATIVE
Leukocytes,UA: NEGATIVE
Nitrite, UA: POSITIVE — AB
RBC, UA: NEGATIVE
Specific Gravity, UA: 1.025 (ref 1.005–1.030)
Urobilinogen, Ur: 0.2 mg/dL (ref 0.2–1.0)
pH, UA: 5.5 (ref 5.0–7.5)

## 2018-11-08 LAB — MICROSCOPIC EXAMINATION: RBC: NONE SEEN /hpf (ref 0–2)

## 2018-11-08 LAB — URINE CULTURE, REFLEX

## 2018-11-09 ENCOUNTER — Other Ambulatory Visit: Payer: Self-pay | Admitting: Family Medicine

## 2018-11-09 MED ORDER — NITROFURANTOIN MONOHYD MACRO 100 MG PO CAPS: 100.0000 mg | ORAL_CAPSULE | Freq: Two times a day (BID) | ORAL | 0 refills | Status: DC

## 2018-12-02 ENCOUNTER — Encounter: Payer: Self-pay | Admitting: Family Medicine

## 2018-12-03 ENCOUNTER — Ambulatory Visit (INDEPENDENT_AMBULATORY_CARE_PROVIDER_SITE_OTHER): Payer: 59 | Admitting: Family Medicine

## 2018-12-03 ENCOUNTER — Encounter: Payer: Self-pay | Admitting: Family Medicine

## 2018-12-03 ENCOUNTER — Other Ambulatory Visit: Payer: Self-pay

## 2018-12-03 VITALS — BP 123/72 | HR 80 | Temp 99.3°F | Ht 66.0 in | Wt 182.0 lb

## 2018-12-03 DIAGNOSIS — S01331A Puncture wound without foreign body of right ear, initial encounter: Secondary | ICD-10-CM

## 2018-12-03 DIAGNOSIS — F419 Anxiety disorder, unspecified: Secondary | ICD-10-CM

## 2018-12-03 DIAGNOSIS — F339 Major depressive disorder, recurrent, unspecified: Secondary | ICD-10-CM

## 2018-12-03 MED ORDER — QUETIAPINE FUMARATE 50 MG PO TABS: 50.0000 mg | ORAL_TABLET | Freq: Every day | ORAL | 0 refills | Status: DC

## 2018-12-03 MED ORDER — VENLAFAXINE HCL ER 75 MG PO CP24: 75.0000 mg | ORAL_CAPSULE | Freq: Every day | ORAL | 0 refills | Status: DC

## 2018-12-03 MED ORDER — SULFAMETHOXAZOLE-TRIMETHOPRIM 800-160 MG PO TABS: 1.0000 | ORAL_TABLET | Freq: Two times a day (BID) | ORAL | 0 refills | Status: DC

## 2018-12-03 NOTE — Progress Notes (Signed)
BP 123/72   Pulse 80   Temp 99.3 F (37.4 C) (Oral)   Ht 5\' 6"  (1.676 m)   Wt 182 lb (82.6 kg)   SpO2 98%   BMI 29.38 kg/m    Subjective:    Patient ID: Brittany Vega, female    DOB: 06/25/1995, 23 y.o.   MRN: [id number]  HPI: Brittany Vega is a 23 y.o. female  Chief Complaint  Patient presents with  . Ear Pain    swelling, redness right earlobe and underneath, first started this past Monday morning and progressively getting worse   Here today for swollen, painful right earlobe x 4 days where her stretch gage piercing is. States this happens from time to time since she got the gages. Has been using neosporin which does seem to help a bit but now feeling like her lymph nodes on that side of her neck are feeling swollen some too.   Recently increased the effexor to 75 mg, noticed it helped a lot initially but now back to feeling depressed, feeling her moods are labile, and having trouble sleeping. Denies SI/HI. Anxiety is also up, hands sweating and feels her heart races at times.   Depression screen [facility] 2/9 10/30/2018 07/28/2018  Decreased Interest 1 0  Down, Depressed, Hopeless 1 0  PHQ - 2 Score 2 0  Altered sleeping 1 1  Tired, decreased energy 1 1  Change in appetite 0 0  Feeling bad or failure about yourself  0 0  Trouble concentrating 1 1  Moving slowly or fidgety/restless 0 0  Suicidal thoughts 0 0  PHQ-9 Score 5 3  Difficult doing work/chores - Not difficult at all   GAD 7 : Generalized Anxiety Score 10/30/2018 07/28/2018  Nervous, Anxious, on Edge 1 1  Control/stop worrying 0 1  Worry too much - different things 1 1  Trouble relaxing 1 0  Restless 0 0  Easily annoyed or irritable 0 0  Afraid - awful might happen 0 0  Total GAD 7 Score 3 3  Anxiety Difficulty Somewhat difficult Not difficult at all   Relevant past medical, surgical, family and social history reviewed and updated as indicated. Interim medical history since our last visit reviewed. Allergies  and medications reviewed and updated.  Review of Systems  Per HPI unless specifically indicated above     Objective:    BP 123/72   Pulse 80   Temp 99.3 F (37.4 C) (Oral)   Ht 5\' 6"  (1.676 m)   Wt 182 lb (82.6 kg)   SpO2 98%   BMI 29.38 kg/m   Wt Readings from Last 3 Encounters:  12/03/18 182 lb (82.6 kg)  10/30/18 185 lb (83.9 kg)  08/16/18 175 lb (79.4 kg)    Physical Exam Vitals signs and nursing note reviewed.  Constitutional:      Appearance: Normal appearance. She is not ill-appearing.  HENT:     Head: Atraumatic.     Right Ear: Tympanic membrane normal.     Left Ear: Tympanic membrane normal.     Ears:     Comments: Left earlobe piercing benign, no evidence of infection Right earlobe - skin surrounding stretch gage piercing erythematous and edematous, drainage present when pressure applied    Nose: Nose normal.  Eyes:     Extraocular Movements: Extraocular movements intact.     Conjunctiva/sclera: Conjunctivae normal.  Neck:     Musculoskeletal: Normal range of motion and neck supple.  Cardiovascular:  Rate and Rhythm: Normal rate and regular rhythm.     Heart sounds: Normal heart sounds.  Pulmonary:     Effort: Pulmonary effort is normal.     Breath sounds: Normal breath sounds.  Musculoskeletal: Normal range of motion.  Skin:    General: Skin is warm and dry.  Neurological:     Mental Status: She is alert and oriented to person, place, and time.  Psychiatric:        Mood and Affect: Mood normal.        Thought Content: Thought content normal.        Judgment: Judgment normal.     Results for orders placed or performed in visit on 11/06/18  Microscopic Examination   URINE  Result Value Ref Range   WBC, UA 0-5 0 - 5 /hpf   RBC None seen 0 - 2 /hpf   Epithelial Cells (non renal) 0-10 0 - 10 /hpf   Mucus, UA Present Not Estab.   Bacteria, UA Many (A) None seen/Few  Urine Culture, Reflex   URINE  Result Value Ref Range   Urine Culture,  Routine Final report    Organism ID, Bacteria Comment   UA/M w/rflx Culture, Routine   Specimen: Urine   URINE  Result Value Ref Range   Specific Gravity, UA 1.025 1.005 - 1.030   pH, UA 5.5 5.0 - 7.5   Color, UA Orange Yellow   Appearance Ur Clear Clear   Leukocytes,UA Negative Negative   Protein,UA Trace (A) Negative/Trace   Glucose, UA Negative Negative   Ketones, UA Trace (A) Negative   RBC, UA Negative Negative   Bilirubin, UA Negative Negative   Urobilinogen, Ur 0.2 0.2 - 1.0 mg/dL   Nitrite, UA Positive (A) Negative   Microscopic Examination See below:    Urinalysis Reflex Comment       Assessment & Plan:   Problem List Items Addressed This Visit      Other   Anxiety   Relevant Medications   venlafaxine XR (EFFEXOR XR) 75 MG 24 hr capsule   Depression, recurrent (HCC)    Currently with low, labile moods and breakthrough anxiety. Will continue effexor and add seroquel at bedtime. F/u in 1 month for recheck. Risks and side effects reviewed.       Relevant Medications   venlafaxine XR (EFFEXOR XR) 75 MG 24 hr capsule    Other Visit Diagnoses    Complication of right ear piercing, initial encounter    -  Primary   Acute infection related to stretch gage earrings. Bactrim sent, warm compresses, neosporin. F/u if not improving   Flu vaccine need       Relevant Orders   Flu Vaccine QUAD 36+ mos IM       Follow up plan: Return in about 4 weeks (around 12/31/2018) for Sleep, mood f/u.

## 2018-12-03 NOTE — Assessment & Plan Note (Signed)
Currently with low, labile moods and breakthrough anxiety. Will continue effexor and add seroquel at bedtime. F/u in 1 month for recheck. Risks and side effects reviewed.

## 2018-12-07 ENCOUNTER — Telehealth: Payer: Self-pay

## 2018-12-07 NOTE — Telephone Encounter (Signed)
Prior Authorization initiated via [employer] for Seroquel Confirmation [id number] Prior Approval [id number]  Approved

## 2019-01-06 ENCOUNTER — Ambulatory Visit (INDEPENDENT_AMBULATORY_CARE_PROVIDER_SITE_OTHER): Payer: 59 | Admitting: Family Medicine

## 2019-01-06 ENCOUNTER — Encounter: Payer: Self-pay | Admitting: Family Medicine

## 2019-01-06 ENCOUNTER — Other Ambulatory Visit: Payer: Self-pay

## 2019-01-06 VITALS — BP 106/72 | HR 93 | Temp 98.4°F | Ht 66.0 in | Wt 182.0 lb

## 2019-01-06 DIAGNOSIS — F339 Major depressive disorder, recurrent, unspecified: Secondary | ICD-10-CM | POA: Diagnosis not present

## 2019-01-06 DIAGNOSIS — R300 Dysuria: Secondary | ICD-10-CM | POA: Diagnosis not present

## 2019-01-06 MED ORDER — ARIPIPRAZOLE 2 MG PO TABS: 2.0000 mg | ORAL_TABLET | Freq: Every day | ORAL | 0 refills | Status: DC

## 2019-01-06 MED ORDER — VENLAFAXINE HCL ER 150 MG PO CP24: 150.0000 mg | ORAL_CAPSULE | Freq: Every day | ORAL | 0 refills | Status: DC

## 2019-01-06 NOTE — Progress Notes (Signed)
BP 106/72 (BP Location: Left Arm, Patient Position: Sitting, Cuff Size: Normal)   Pulse 93   Temp 98.4 F (36.9 C) (Oral)   Ht 5\' 6"  (1.676 m)   Wt 182 lb (82.6 kg)   SpO2 99%   BMI 29.38 kg/m    Subjective:    Patient ID: , female    DOB: 06/02/1995, 23 y.o.   MRN: [id number]  HPI: Brittany Vega is a 23 y.o. female  Chief Complaint  Patient presents with  . Anxiety    Patient states that Seroquel didn't work.   . Depression   Here today for 1 month mood f/u after adding seroquel for mood stabilization. Did not feel good on the seroquel, did not help with sleep at all and caused restless legs. Stopped it about 2 weeks ago. Still taking her effexor but notes she continues to have low lows and irritability with some mood lability. Denies SI/HI.   Also getting frequent UTI sxs off and on. Just bought a cranberry supplement yesterday that she has not yet started. Uses unscented soap, urinates directly after intercourse, tries to stay very clean. Notes she is typically able to drink lots of fluids and sxs improve. Denies fever, chills, hematuria, vaginal discharge, concern for STIs.   Depression screen [facility] 2/9 10/30/2018 07/28/2018  Decreased Interest 1 0  Down, Depressed, Hopeless 1 0  PHQ - 2 Score 2 0  Altered sleeping 1 1  Tired, decreased energy 1 1  Change in appetite 0 0  Feeling bad or failure about yourself  0 0  Trouble concentrating 1 1  Moving slowly or fidgety/restless 0 0  Suicidal thoughts 0 0  PHQ-9 Score 5 3  Difficult doing work/chores - Not difficult at all   GAD 7 : Generalized Anxiety Score 10/30/2018 07/28/2018  Nervous, Anxious, on Edge 1 1  Control/stop worrying 0 1  Worry too much - different things 1 1  Trouble relaxing 1 0  Restless 0 0  Easily annoyed or irritable 0 0  Afraid - awful might happen 0 0  Total GAD 7 Score 3 3  Anxiety Difficulty Somewhat difficult Not difficult at all     Relevant past medical, surgical, family  and social history reviewed and updated as indicated. Interim medical history since our last visit reviewed. Allergies and medications reviewed and updated.  Review of Systems  Per HPI unless specifically indicated above     Objective:    BP 106/72 (BP Location: Left Arm, Patient Position: Sitting, Cuff Size: Normal)   Pulse 93   Temp 98.4 F (36.9 C) (Oral)   Ht 5\' 6"  (1.676 m)   Wt 182 lb (82.6 kg)   SpO2 99%   BMI 29.38 kg/m   Wt Readings from Last 3 Encounters:  01/06/19 182 lb (82.6 kg)  12/03/18 182 lb (82.6 kg)  10/30/18 185 lb (83.9 kg)    Physical Exam Vitals signs and nursing note reviewed.  Constitutional:      Appearance: Normal appearance. She is not ill-appearing.  HENT:     Head: Atraumatic.  Eyes:     Extraocular Movements: Extraocular movements intact.     Conjunctiva/sclera: Conjunctivae normal.  Neck:     Musculoskeletal: Normal range of motion and neck supple.  Cardiovascular:     Rate and Rhythm: Normal rate and regular rhythm.     Heart sounds: Normal heart sounds.  Pulmonary:     Effort: Pulmonary effort is normal.  Breath sounds: Normal breath sounds.  Genitourinary:    Comments: Declines GU exam Musculoskeletal: Normal range of motion.  Skin:    General: Skin is warm and dry.  Neurological:     Mental Status: She is alert and oriented to person, place, and time.  Psychiatric:        Mood and Affect: Mood normal.        Thought Content: Thought content normal.        Judgment: Judgment normal.     Results for orders placed or performed in visit on 11/06/18  Microscopic Examination   URINE  Result Value Ref Range   WBC, UA 0-5 0 - 5 /hpf   RBC None seen 0 - 2 /hpf   Epithelial Cells (non renal) 0-10 0 - 10 /hpf   Mucus, UA Present Not Estab.   Bacteria, UA Many (A) None seen/Few  Urine Culture, Reflex   URINE  Result Value Ref Range   Urine Culture, Routine Final report    Organism ID, Bacteria Comment   UA/M w/rflx  Culture, Routine   Specimen: Urine   URINE  Result Value Ref Range   Specific Gravity, UA 1.025 1.005 - 1.030   pH, UA 5.5 5.0 - 7.5   Color, UA Orange Yellow   Appearance Ur Clear Clear   Leukocytes,UA Negative Negative   Protein,UA Trace (A) Negative/Trace   Glucose, UA Negative Negative   Ketones, UA Trace (A) Negative   RBC, UA Negative Negative   Bilirubin, UA Negative Negative   Urobilinogen, Ur 0.2 0.2 - 1.0 mg/dL   Nitrite, UA Positive (A) Negative   Microscopic Examination See below:    Urinalysis Reflex Comment       Assessment & Plan:   Problem List Items Addressed This Visit      Other   Depression, recurrent ([facility]) - Primary    Will increase effexor and add abilify. Monitor closely for benefit, recheck in 1 month      Relevant Medications   venlafaxine XR (EFFEXOR XR) 150 MG 24 hr capsule    Other Visit Diagnoses    Dysuria       Intermittent, ongoing. Pt wishes to try her supplement for a few weeks and will do wet prep and repeat U/A if not benefiting       Follow up plan: Return in about 4 weeks (around 02/03/2019) for Mood f/u.

## 2019-01-06 NOTE — Assessment & Plan Note (Signed)
Will increase effexor and add abilify. Monitor closely for benefit, recheck in 1 month

## 2019-02-03 ENCOUNTER — Ambulatory Visit (INDEPENDENT_AMBULATORY_CARE_PROVIDER_SITE_OTHER): Payer: 59 | Admitting: Family Medicine

## 2019-02-03 ENCOUNTER — Other Ambulatory Visit: Payer: Self-pay

## 2019-02-03 ENCOUNTER — Encounter: Payer: Self-pay | Admitting: Family Medicine

## 2019-02-03 VITALS — BP 129/79 | HR 97 | Temp 98.5°F | Ht 66.0 in | Wt 194.0 lb

## 2019-02-03 DIAGNOSIS — F419 Anxiety disorder, unspecified: Secondary | ICD-10-CM | POA: Diagnosis not present

## 2019-02-03 DIAGNOSIS — F339 Major depressive disorder, recurrent, unspecified: Secondary | ICD-10-CM | POA: Diagnosis not present

## 2019-02-03 DIAGNOSIS — R300 Dysuria: Secondary | ICD-10-CM | POA: Diagnosis not present

## 2019-02-03 DIAGNOSIS — N390 Urinary tract infection, site not specified: Secondary | ICD-10-CM

## 2019-02-03 LAB — WET PREP FOR TRICH, YEAST, CLUE
Clue Cell Exam: NEGATIVE
Trichomonas Exam: NEGATIVE
Yeast Exam: NEGATIVE

## 2019-02-03 MED ORDER — ARIPIPRAZOLE 5 MG PO TABS: 5.0000 mg | ORAL_TABLET | Freq: Every day | ORAL | 1 refills | Status: DC

## 2019-02-03 MED ORDER — NITROFURANTOIN MONOHYD MACRO 100 MG PO CAPS: 100.0000 mg | ORAL_CAPSULE | Freq: Two times a day (BID) | ORAL | 0 refills | Status: DC

## 2019-02-03 MED ORDER — VENLAFAXINE HCL ER 150 MG PO CP24: 150.0000 mg | ORAL_CAPSULE | Freq: Every day | ORAL | 1 refills | Status: DC

## 2019-02-03 NOTE — Assessment & Plan Note (Signed)
Significant improvement on the effexor 150 mg and abilify combination. WIll increase to 5 mg on the Abilify and continue to monitor.

## 2019-02-03 NOTE — Progress Notes (Signed)
BP 129/79   Pulse 97   Temp 98.5 F (36.9 C) (Oral)   Ht 5\' 6"  (1.676 m)   Wt 194 lb (88 kg)   SpO2 96%   BMI 31.31 kg/m    Subjective:    Patient ID: , female    DOB: 1995-06-30, 23 y.o.   MRN: [id number]  HPI: Brittany Vega is a 23 y.o. female  Chief Complaint  Patient presents with  . Anxiety  . Depression   Here today for 1 month f/u moods after increasing effexor and adding low dose abilify. Tolerating both very well and feels great about where her moods have been. No major mood swings, agitation, low days, SI/HI.   Having dysuria and sometimes vaginal burning. Has been using AZO nearly every day which does seem to help temporarily. Denies fever, chills, abdominal pain, vaginal discharge, hematuria, concern for STIs.   Relevant past medical, surgical, family and social history reviewed and updated as indicated. Interim medical history since our last visit reviewed. Allergies and medications reviewed and updated.  Review of Systems  Per HPI unless specifically indicated above     Objective:    BP 129/79   Pulse 97   Temp 98.5 F (36.9 C) (Oral)   Ht 5\' 6"  (1.676 m)   Wt 194 lb (88 kg)   SpO2 96%   BMI 31.31 kg/m   Wt Readings from Last 3 Encounters:  02/03/19 194 lb (88 kg)  01/06/19 182 lb (82.6 kg)  12/03/18 182 lb (82.6 kg)    Physical Exam Vitals signs and nursing note reviewed.  Constitutional:      Appearance: Normal appearance. She is not ill-appearing.  HENT:     Head: Atraumatic.  Eyes:     Extraocular Movements: Extraocular movements intact.     Conjunctiva/sclera: Conjunctivae normal.  Neck:     Musculoskeletal: Normal range of motion and neck supple.  Cardiovascular:     Rate and Rhythm: Normal rate and regular rhythm.     Heart sounds: Normal heart sounds.  Pulmonary:     Effort: Pulmonary effort is normal.     Breath sounds: Normal breath sounds.  Abdominal:     General: Bowel sounds are normal.     Palpations:  Abdomen is soft.     Tenderness: There is no abdominal tenderness. There is no right CVA tenderness, left CVA tenderness or guarding.  Musculoskeletal: Normal range of motion.  Skin:    General: Skin is warm and dry.  Neurological:     Mental Status: She is alert and oriented to person, place, and time.  Psychiatric:        Mood and Affect: Mood normal.        Thought Content: Thought content normal.        Judgment: Judgment normal.     Results for orders placed or performed in visit on 02/03/19  WET PREP FOR TRICH, YEAST, CLUE   Specimen: Urine  Result Value Ref Range   Trichomonas Exam Negative Negative   Yeast Exam Negative Negative   Clue Cell Exam Negative Negative  Microscopic Examination  Result Value Ref Range   WBC, UA >30 (A) 0 - 5 /hpf   RBC 3-10 (A) 0 - 2 /hpf   Epithelial Cells (non renal) 0-10 0 - 10 /hpf   Bacteria, UA Few (A) None seen/Few  Urine Culture, Reflex  Result Value Ref Range   Urine Culture, Routine WILL FOLLOW   UA/M  w/rflx Culture, Routine   Specimen: Urine  Result Value Ref Range   Specific Gravity, UA 1.020 1.005 - 1.030   pH, UA 7.5 5.0 - 7.5   Color, UA Yellow Yellow   Appearance Ur Cloudy (A) Clear   Leukocytes,UA 3+ (A) Negative   Protein,UA Negative Negative/Trace   Glucose, UA Negative Negative   Ketones, UA Negative Negative   RBC, UA Trace (A) Negative   Bilirubin, UA Negative Negative   Urobilinogen, Ur 0.2 0.2 - 1.0 mg/dL   Nitrite, UA Negative Negative   Microscopic Examination See below:    Urinalysis Reflex Comment       Assessment & Plan:   Problem List Items Addressed This Visit      Other   Anxiety    Significant improvement on the effexor 150 mg and abilify combination. WIll increase to 5 mg on the Abilify and continue to monitor.       Relevant Medications   venlafaxine XR (EFFEXOR XR) 150 MG 24 hr capsule   Depression, recurrent (HCC) - Primary    Significant improvement on the effexor 150 mg and abilify  combination. WIll increase to 5 mg on the Abilify and continue to monitor.       Relevant Medications   venlafaxine XR (EFFEXOR XR) 150 MG 24 hr capsule    Other Visit Diagnoses    Acute lower UTI       Tx with macrobid and probiotics, push fluids. Await urine culture results   Relevant Medications   nitrofurantoin, macrocrystal-monohydrate, (MACROBID) 100 MG capsule   Other Relevant Orders   UA/M w/rflx Culture, Routine (Completed)   WET PREP FOR [place], YEAST, CLUE (Completed)       Follow up plan: Return in about 6 months (around 08/04/2019) for Mood f/u.

## 2019-02-05 LAB — UA/M W/RFLX CULTURE, ROUTINE
Bilirubin, UA: NEGATIVE
Glucose, UA: NEGATIVE
Ketones, UA: NEGATIVE
Nitrite, UA: NEGATIVE
Protein,UA: NEGATIVE
Specific Gravity, UA: 1.02 (ref 1.005–1.030)
Urobilinogen, Ur: 0.2 mg/dL (ref 0.2–1.0)
pH, UA: 7.5 (ref 5.0–7.5)

## 2019-02-05 LAB — URINE CULTURE, REFLEX

## 2019-02-05 LAB — MICROSCOPIC EXAMINATION: WBC, UA: >30 /hpf — AB (ref 0–5)

## 2019-02-06 NOTE — Assessment & Plan Note (Signed)
Significant improvement on the effexor 150 mg and abilify combination. WIll increase to 5 mg on the Abilify and continue to monitor.  

## 2019-03-15 ENCOUNTER — Other Ambulatory Visit: Payer: Self-pay

## 2019-03-15 ENCOUNTER — Encounter: Payer: Self-pay | Admitting: Family Medicine

## 2019-03-15 ENCOUNTER — Ambulatory Visit (INDEPENDENT_AMBULATORY_CARE_PROVIDER_SITE_OTHER): Payer: 59 | Admitting: Family Medicine

## 2019-03-15 VITALS — BP 115/74 | HR 87 | Temp 98.5°F | Ht 66.0 in | Wt 199.0 lb

## 2019-03-15 DIAGNOSIS — Z111 Encounter for screening for respiratory tuberculosis: Secondary | ICD-10-CM | POA: Diagnosis not present

## 2019-03-15 DIAGNOSIS — Z113 Encounter for screening for infections with a predominantly sexual mode of transmission: Secondary | ICD-10-CM | POA: Diagnosis not present

## 2019-03-15 DIAGNOSIS — Z0000 Encounter for general adult medical examination without abnormal findings: Secondary | ICD-10-CM | POA: Diagnosis not present

## 2019-03-15 NOTE — Progress Notes (Signed)
BP 115/74   Pulse 87   Temp 98.5 F (36.9 C) (Oral)   Ht 5\' 6"  (1.676 m)   Wt 199 lb (90.3 kg)   SpO2 96%   BMI 32.12 kg/m    Subjective:    Patient ID: Brittany Vega, female    DOB: 1995/08/20, 23 y.o.   MRN: [id number]  HPI: Brittany Vega is a 23 y.o. female presenting on 03/15/2019 for comprehensive medical examination. Current medical complaints include:none  She currently lives with: Menopausal Symptoms: no  Depression Screen done today and results listed below:  Depression screen [facility] 2/9 03/15/2019 02/03/2019 01/06/2019 10/30/2018 07/28/2018  Decreased Interest 1 1 2 1  0  Down, Depressed, Hopeless 1 1 1 1  0  PHQ - 2 Score 2 2 3 2  0  Altered sleeping 2 2 3 1 1   Tired, decreased energy 1 1 2 1 1   Change in appetite 0 0 0 0 0  Feeling bad or failure about yourself  1 1 1  0 0  Trouble concentrating 3 1 0 1 1  Moving slowly or fidgety/restless 0 0 0 0 0  Suicidal thoughts 0 0 0 0 0  PHQ-9 Score 9 7 9 5 3   Difficult doing work/chores - - - - Not difficult at all    The patient does not have a history of falls. I did complete a risk assessment for falls. A plan of care for falls was documented.   Past Medical History:  Past Medical History:  Diagnosis Date  . Depression     Surgical History:  History reviewed. No pertinent surgical history.  Medications:  Current Outpatient Medications on File Prior to Visit  Medication Sig  . ARIPiprazole (ABILIFY) 5 MG tablet Take 1 tablet (5 mg total) by mouth daily.  Marland Kitchen etonogestrel (NEXPLANON) 68 MG IMPL implant 1 each by Subdermal route once. Placed April 2019  . venlafaxine XR (EFFEXOR XR) 150 MG 24 hr capsule Take 1 capsule (150 mg total) by mouth daily with breakfast.   No current facility-administered medications on file prior to visit.    Allergies:  Allergies  Allergen Reactions  . Seroquel [Quetiapine Fumarate] Other (See Comments)    Restless legs    Social History:  Social History   Socioeconomic History   . Marital status: Single    Spouse name: Not on file  . Number of children: Not on file  . Years of education: Not on file  . Highest education level: Not on file  Occupational History  . Not on file  Tobacco Use  . Smoking status: Never Smoker  . Smokeless tobacco: Never Used  Substance and Sexual Activity  . Alcohol use: No  . Drug use: No  . Sexual activity: Never  Other Topics Concern  . Not on file  Social History Narrative  . Not on file   Social Determinants of Health   Financial Resource Strain:   . Difficulty of Paying Living Expenses: Not on file  Food Insecurity:   . Worried About Charity fundraiser in the Last Year: Not on file  . Ran Out of Food in the Last Year: Not on file  Transportation Needs:   . Lack of Transportation (Medical): Not on file  . Lack of Transportation (Non-Medical): Not on file  Physical Activity:   . Days of Exercise per Week: Not on file  . Minutes of Exercise per Session: Not on file  Stress:   . Feeling of Stress :  Not on file  Social Connections:   . Frequency of Communication with Friends and Family: Not on file  . Frequency of Social Gatherings with Friends and Family: Not on file  . Attends Religious Services: Not on file  . Active Member of Clubs or Organizations: Not on file  . Attends BankerClub or Organization Meetings: Not on file  . Marital Status: Not on file  Intimate Partner Violence:   . Fear of Current or Ex-Partner: Not on file  . Emotionally Abused: Not on file  . Physically Abused: Not on file  . Sexually Abused: Not on file   Social History   Tobacco Use  Smoking Status Never Smoker  Smokeless Tobacco Never Used   Social History   Substance and Sexual Activity  Alcohol Use No    Family History:  Family History  Problem Relation Age of Onset  . Congestive Heart Failure Maternal Grandmother   . COPD Maternal Grandmother   . Fibromyalgia Maternal Grandmother     Past medical history, surgical  history, medications, allergies, family history and social history reviewed with patient today and changes made to appropriate areas of the chart.   Review of Systems - General ROS: negative Psychological ROS: negative Ophthalmic ROS: negative ENT ROS: negative Allergy and Immunology ROS: negative Hematological and Lymphatic ROS: negative Endocrine ROS: negative Breast ROS: negative for breast lumps Respiratory ROS: no cough, shortness of breath, or wheezing Cardiovascular ROS: no chest pain or dyspnea on exertion Gastrointestinal ROS: no abdominal pain, change in bowel habits, or black or bloody stools Genito-Urinary ROS: no dysuria, trouble voiding, or hematuria Musculoskeletal ROS: negative Neurological ROS: no TIA or stroke symptoms Dermatological ROS: negative All other ROS negative except what is listed above and in the HPI.      Objective:    BP 115/74   Pulse 87   Temp 98.5 F (36.9 C) (Oral)   Ht 5\' 6"  (1.676 m)   Wt 199 lb (90.3 kg)   SpO2 96%   BMI 32.12 kg/m   Wt Readings from Last 3 Encounters:  03/15/19 199 lb (90.3 kg)  02/03/19 194 lb (88 kg)  01/06/19 182 lb (82.6 kg)    Physical Exam Vitals and nursing note reviewed.  Constitutional:      General: She is not in acute distress.    Appearance: She is well-developed.  HENT:     Head: Atraumatic.     Right Ear: External ear normal.     Left Ear: External ear normal.     Nose: Nose normal.     Mouth/Throat:     Pharynx: No oropharyngeal exudate.  Eyes:     General: No scleral icterus.    Conjunctiva/sclera: Conjunctivae normal.     Pupils: Pupils are equal, round, and reactive to light.  Neck:     Thyroid: No thyromegaly.  Cardiovascular:     Rate and Rhythm: Normal rate and regular rhythm.     Heart sounds: Normal heart sounds.  Pulmonary:     Effort: Pulmonary effort is normal. No respiratory distress.     Breath sounds: Normal breath sounds.  Abdominal:     General: Bowel sounds are normal.      Palpations: Abdomen is soft. There is no mass.     Tenderness: There is no abdominal tenderness.  Genitourinary:    Comments: Declines GU exam Musculoskeletal:        General: No tenderness. Normal range of motion.     Cervical back: Normal  range of motion and neck supple.  Lymphadenopathy:     Cervical: No cervical adenopathy.  Skin:    General: Skin is warm and dry.     Findings: No rash.  Neurological:     Mental Status: She is alert and oriented to person, place, and time.     Cranial Nerves: No cranial nerve deficit.  Psychiatric:        Behavior: Behavior normal.     Results for orders placed or performed in visit on 03/15/19  Microscopic Examination   URINE  Result Value Ref Range   WBC, UA 6-10 (A) 0 - 5 /hpf   RBC None seen 0 - 2 /hpf   Epithelial Cells (non renal) 0-10 0 - 10 /hpf   Bacteria, UA Many (A) None seen/Few  Urine Culture, Reflex   URINE  Result Value Ref Range   Urine Culture, Routine Final report    Organism ID, Bacteria Comment   HIV antibody  Result Value Ref Range   HIV Screen 4th Generation wRfx Non Reactive Non Reactive  QuantiFERON-TB Gold Plus  Result Value Ref Range   QuantiFERON Incubation WILL FOLLOW    QuantiFERON Criteria WILL FOLLOW    QuantiFERON TB1 Ag Value WILL FOLLOW    QuantiFERON TB2 Ag Value WILL FOLLOW    QuantiFERON Nil Value WILL FOLLOW    QuantiFERON Mitogen Value WILL FOLLOW    QuantiFERON-TB Gold Plus WILL FOLLOW   CBC with Differential/Platelet out  Result Value Ref Range   WBC 5.2 3.4 - 10.8 x10E3/uL   RBC 4.86 3.77 - 5.28 x10E6/uL   Hemoglobin 13.5 11.1 - 15.9 g/dL   Hematocrit 50.0 93.8 - 46.6 %   MCV 83 79 - 97 fL   MCH 27.8 26.6 - 33.0 pg   MCHC 33.4 31.5 - 35.7 g/dL   RDW 18.2 99.3 - 71.6 %   Platelets 319 150 - 450 x10E3/uL   Neutrophils 60 Not Estab. %   Lymphs 28 Not Estab. %   Monocytes 8 Not Estab. %   Eos 3 Not Estab. %   Basos 1 Not Estab. %   Neutrophils Absolute 3.1 1.4 - 7.0 x10E3/uL    Lymphocytes Absolute 1.5 0.7 - 3.1 x10E3/uL   Monocytes Absolute 0.4 0.1 - 0.9 x10E3/uL   EOS (ABSOLUTE) 0.2 0.0 - 0.4 x10E3/uL   Basophils Absolute 0.0 0.0 - 0.2 x10E3/uL   Immature Granulocytes 0 Not Estab. %   Immature Grans (Abs) 0.0 0.0 - 0.1 x10E3/uL  Comprehensive metabolic panel  Result Value Ref Range   Glucose 79 65 - 99 mg/dL   BUN 9 6 - 20 mg/dL   Creatinine, Ser 9.67 0.57 - 1.00 mg/dL   GFR calc non Af Amer 132 >59 mL/min/1.73   GFR calc Af Amer 152 >59 mL/min/1.73   BUN/Creatinine Ratio 16 9 - 23   Sodium 139 134 - 144 mmol/L   Potassium 4.2 3.5 - 5.2 mmol/L   Chloride 104 96 - 106 mmol/L   CO2 24 20 - 29 mmol/L   Calcium 9.2 8.7 - 10.2 mg/dL   Total Protein 6.7 6.0 - 8.5 g/dL   Albumin 4.5 3.9 - 5.0 g/dL   Globulin, Total 2.2 1.5 - 4.5 g/dL   Albumin/Globulin Ratio 2.0 1.2 - 2.2   Bilirubin Total 0.3 0.0 - 1.2 mg/dL   Alkaline Phosphatase 67 39 - 117 IU/L   AST 36 0 - 40 IU/L   ALT 29 0 - 32 IU/L  Lipid  Panel w/o Chol/HDL Ratio out  Result Value Ref Range   Cholesterol, Total 174 100 - 199 mg/dL   Triglycerides 68 0 - 149 mg/dL   HDL 56 >40 mg/dL   VLDL Cholesterol Cal 13 5 - 40 mg/dL   LDL Chol Calc (NIH) 981 (H) 0 - 99 mg/dL  TSH  Result Value Ref Range   TSH 0.851 0.450 - 4.500 uIU/mL  UA/M w/rflx Culture, Routine   Specimen: Urine   URINE  Result Value Ref Range   Specific Gravity, UA 1.015 1.005 - 1.030   pH, UA 8.5 (H) 5.0 - 7.5   Color, UA Yellow Yellow   Appearance Ur Hazy (A) Clear   Leukocytes,UA Trace (A) Negative   Protein,UA 1+ (A) Negative/Trace   Glucose, UA Negative Negative   Ketones, UA Trace (A) Negative   RBC, UA Negative Negative   Bilirubin, UA Negative Negative   Urobilinogen, Ur 0.2 0.2 - 1.0 mg/dL   Nitrite, UA Negative Negative   Microscopic Examination See below:    Urinalysis Reflex Comment   RPR  Result Value Ref Range   RPR Ser Ql Non Reactive Non Reactive  HSV(herpes simplex vrs) 1+2 ab-IgG  Result Value Ref  Range   HSV 1 Glycoprotein G Ab, IgG <0.91 0.00 - 0.90 index   HSV 2 IgG, Type Spec <0.91 0.00 - 0.90 index      Assessment & Plan:   Problem List Items Addressed This Visit    None    Visit Diagnoses    Annual physical exam    -  Primary   Forms for nursing school completed. Labs drawn, await results   Relevant Orders   CBC with Differential/Platelet out (Completed)   Comprehensive metabolic panel (Completed)   Lipid Panel w/o Chol/HDL Ratio out (Completed)   TSH (Completed)   UA/M w/rflx Culture, Routine (Completed)   Routine screening for STI (sexually transmitted infection)       Relevant Orders   HIV antibody (Completed)   RPR (Completed)   HSV(herpes simplex vrs) 1+2 ab-IgG (Completed)   GC/Chlamydia Probe Amp   Screening for tuberculosis       Relevant Orders   QuantiFERON-TB Gold Plus (Completed)       Follow up plan: Return in about 6 months (around 09/13/2019) for 6 month f/u moods.   LABORATORY TESTING:  - Pap smear: pap refused  IMMUNIZATIONS:   - Tdap: Tetanus vaccination status reviewed: last tetanus booster within 10 years. - Influenza: Up to date  PATIENT COUNSELING:   Advised to take 1 mg of folate supplement per day if capable of pregnancy.   Sexuality: Discussed sexually transmitted diseases, partner selection, use of condoms, avoidance of unintended pregnancy  and contraceptive alternatives.   Advised to avoid cigarette smoking.  I discussed with the patient that most people either abstain from alcohol or drink within safe limits (<=14/week and <=4 drinks/occasion for males, <=7/weeks and <= 3 drinks/occasion for females) and that the risk for alcohol disorders and other health effects rises proportionally with the number of drinks per week and how often a drinker exceeds daily limits.  Discussed cessation/primary prevention of drug use and availability of treatment for abuse.   Diet: Encouraged to adjust caloric intake to maintain  or achieve  ideal body weight, to reduce intake of dietary saturated fat and total fat, to limit sodium intake by avoiding high sodium foods and not adding table salt, and to maintain adequate dietary potassium and calcium preferably from  fresh fruits, vegetables, and low-fat dairy products.    stressed the importance of regular exercise  Injury prevention: Discussed safety belts, safety helmets, smoke detector, smoking near bedding or upholstery.   Dental health: Discussed importance of regular tooth brushing, flossing, and dental visits.    NEXT PREVENTATIVE PHYSICAL DUE IN 1 YEAR. Return in about 6 months (around 09/13/2019) for 6 month f/u moods.

## 2019-03-16 ENCOUNTER — Other Ambulatory Visit: Payer: Self-pay | Admitting: Family Medicine

## 2019-03-16 MED ORDER — SULFAMETHOXAZOLE-TRIMETHOPRIM 800-160 MG PO TABS: 1.0000 | ORAL_TABLET | Freq: Two times a day (BID) | ORAL | 0 refills | Status: DC

## 2019-03-17 LAB — UA/M W/RFLX CULTURE, ROUTINE
Bilirubin, UA: NEGATIVE
Glucose, UA: NEGATIVE
Nitrite, UA: NEGATIVE
RBC, UA: NEGATIVE
Specific Gravity, UA: 1.015 (ref 1.005–1.030)
Urobilinogen, Ur: 0.2 mg/dL (ref 0.2–1.0)
pH, UA: 8.5 — ABNORMAL HIGH (ref 5.0–7.5)

## 2019-03-17 LAB — URINE CULTURE, REFLEX

## 2019-03-17 LAB — MICROSCOPIC EXAMINATION: RBC, Urine: NONE SEEN /hpf (ref 0–2)

## 2019-03-18 LAB — COMPREHENSIVE METABOLIC PANEL
ALT: 29 IU/L (ref 0–32)
AST: 36 IU/L (ref 0–40)
Albumin/Globulin Ratio: 2 (ref 1.2–2.2)
Albumin: 4.5 g/dL (ref 3.9–5.0)
Alkaline Phosphatase: 67 IU/L (ref 39–117)
BUN/Creatinine Ratio: 16 (ref 9–23)
BUN: 9 mg/dL (ref 6–20)
Bilirubin Total: 0.3 mg/dL (ref 0.0–1.2)
CO2: 24 mmol/L (ref 20–29)
Calcium: 9.2 mg/dL (ref 8.7–10.2)
Chloride: 104 mmol/L (ref 96–106)
Creatinine, Ser: 0.58 mg/dL (ref 0.57–1.00)
GFR calc Af Amer: 152 mL/min/{1.73_m2} (ref >59)
GFR calc non Af Amer: 132 mL/min/{1.73_m2} (ref >59)
Globulin, Total: 2.2 g/dL (ref 1.5–4.5)
Glucose: 79 mg/dL (ref 65–99)
Potassium: 4.2 mmol/L (ref 3.5–5.2)
Sodium: 139 mmol/L (ref 134–144)
Total Protein: 6.7 g/dL (ref 6.0–8.5)

## 2019-03-18 LAB — QUANTIFERON-TB GOLD PLUS
QuantiFERON Mitogen Value: >10 IU/mL
QuantiFERON Nil Value: 0.02 IU/mL
QuantiFERON TB1 Ag Value: 0.04 IU/mL
QuantiFERON TB2 Ag Value: 0.02 IU/mL
QuantiFERON-TB Gold Plus: NEGATIVE

## 2019-03-18 LAB — CBC WITH DIFFERENTIAL/PLATELET
Basophils Absolute: 0 10*3/uL (ref 0.0–0.2)
Basos: 1 %
EOS (ABSOLUTE): 0.2 10*3/uL (ref 0.0–0.4)
Eos: 3 %
Hematocrit: 40.4 % (ref 34.0–46.6)
Hemoglobin: 13.5 g/dL (ref 11.1–15.9)
Immature Grans (Abs): 0 10*3/uL (ref 0.0–0.1)
Immature Granulocytes: 0 %
Lymphocytes Absolute: 1.5 10*3/uL (ref 0.7–3.1)
Lymphs: 28 %
MCH: 27.8 pg (ref 26.6–33.0)
MCHC: 33.4 g/dL (ref 31.5–35.7)
MCV: 83 fL (ref 79–97)
Monocytes Absolute: 0.4 10*3/uL (ref 0.1–0.9)
Monocytes: 8 %
Neutrophils Absolute: 3.1 10*3/uL (ref 1.4–7.0)
Neutrophils: 60 %
Platelets: 319 10*3/uL (ref 150–450)
RBC: 4.86 x10E6/uL (ref 3.77–5.28)
RDW: 11.7 % (ref 11.7–15.4)
WBC: 5.2 10*3/uL (ref 3.4–10.8)

## 2019-03-18 LAB — HSV(HERPES SIMPLEX VRS) I + II AB-IGG
HSV 1 Glycoprotein G Ab, IgG: <0.91 index (ref 0.00–0.90)
HSV 2 IgG, Type Spec: <0.91 index (ref 0.00–0.90)

## 2019-03-18 LAB — LIPID PANEL W/O CHOL/HDL RATIO
Cholesterol, Total: 174 mg/dL (ref 100–199)
HDL: 56 mg/dL (ref >39)
LDL Chol Calc (NIH): 105 mg/dL — ABNORMAL HIGH (ref 0–99)
Triglycerides: 68 mg/dL (ref 0–149)
VLDL Cholesterol Cal: 13 mg/dL (ref 5–40)

## 2019-03-18 LAB — HIV ANTIBODY (ROUTINE TESTING W REFLEX): HIV Screen 4th Generation wRfx: NONREACTIVE

## 2019-03-18 LAB — TSH: TSH: 0.851 u[IU]/mL (ref 0.450–4.500)

## 2019-03-18 LAB — RPR: RPR Ser Ql: NONREACTIVE

## 2019-03-25 ENCOUNTER — Telehealth: Payer: Self-pay

## 2019-03-25 DIAGNOSIS — Z0184 Encounter for antibody response examination: Secondary | ICD-10-CM

## 2019-03-25 DIAGNOSIS — Z23 Encounter for immunization: Secondary | ICD-10-CM

## 2019-03-25 NOTE — Telephone Encounter (Signed)
  Copied from [place] [id number]. Topic: General - Other >> Mar 25, 2019  4:07 PM Celene Kras wrote: Reason for CRM: Pt called and is needing to have her vaccines. Please advise.   Called patient. LVM for patient to return call to the office.

## 2019-03-25 NOTE — Telephone Encounter (Signed)
Patient is calling back for Brittany Vega. Thank you

## 2019-03-26 NOTE — Telephone Encounter (Signed)
Called and spoke to patient. She states that she needs a Hep B titer, Varicella titer, and Tdap vaccine for school. Patient had cpe 03/15/19. Can these orders be entered? The patient wants to come Monday to have this completed.

## 2019-03-27 ENCOUNTER — Encounter: Payer: Self-pay | Admitting: Family Medicine

## 2019-03-29 ENCOUNTER — Other Ambulatory Visit: Payer: 59

## 2019-03-29 ENCOUNTER — Other Ambulatory Visit: Payer: Self-pay

## 2019-03-29 ENCOUNTER — Ambulatory Visit (INDEPENDENT_AMBULATORY_CARE_PROVIDER_SITE_OTHER): Payer: 59

## 2019-03-29 DIAGNOSIS — Z0184 Encounter for antibody response examination: Secondary | ICD-10-CM | POA: Diagnosis not present

## 2019-03-29 DIAGNOSIS — Z23 Encounter for immunization: Secondary | ICD-10-CM

## 2019-03-29 NOTE — Telephone Encounter (Signed)
Orders placed.

## 2019-03-30 LAB — HEPATITIS B SURFACE ANTIBODY,QUALITATIVE: Hep B Surface Ab, Qual: REACTIVE

## 2019-03-30 LAB — VARICELLA ZOSTER ANTIBODY, IGG: Varicella zoster IgG: 365 index (ref >165)

## 2019-03-30 LAB — MEASLES/MUMPS/RUBELLA IMMUNITY
MUMPS ABS, IGG: <9 AU/mL — ABNORMAL LOW (ref >10.9)
RUBEOLA AB, IGG: 135 AU/mL (ref >16.4)
Rubella Antibodies, IGG: 1.82 index (ref >0.99)

## 2019-03-31 ENCOUNTER — Telehealth: Payer: Self-pay | Admitting: Family Medicine

## 2019-03-31 ENCOUNTER — Other Ambulatory Visit: Payer: Self-pay

## 2019-03-31 ENCOUNTER — Ambulatory Visit (INDEPENDENT_AMBULATORY_CARE_PROVIDER_SITE_OTHER): Payer: 59

## 2019-03-31 DIAGNOSIS — Z23 Encounter for immunization: Secondary | ICD-10-CM

## 2019-03-31 NOTE — Telephone Encounter (Signed)
Called and let patient know what Apolonio Schneiders said. Patient is coming today at 59 for her MMR vaccine.

## 2019-03-31 NOTE — Telephone Encounter (Signed)
Reviewed recent lab results, came back not immune to mumps so vaccine is indicated. Order placed, please let her know she can come by to receive whenever she is available  Copied from [place] [phone]. Topic: General - Other >> Mar 30, 2019  4:43 PM Mcneil, Ja-Kwan wrote: Reason for CRM: Pt stated she would like to have the mmr vaccine. Pt requests call back to schedule appt for vaccine

## 2019-06-10 ENCOUNTER — Encounter: Payer: Self-pay | Admitting: Family Medicine

## 2019-06-10 ENCOUNTER — Other Ambulatory Visit: Payer: Self-pay | Admitting: Family Medicine

## 2019-06-10 MED ORDER — ARIPIPRAZOLE 5 MG PO TABS: 5.0000 mg | ORAL_TABLET | Freq: Every day | ORAL | 1 refills | Status: DC

## 2019-06-10 MED ORDER — VENLAFAXINE HCL ER 150 MG PO CP24: 150.0000 mg | ORAL_CAPSULE | Freq: Every day | ORAL | 1 refills | Status: DC

## 2019-06-16 ENCOUNTER — Telehealth (INDEPENDENT_AMBULATORY_CARE_PROVIDER_SITE_OTHER): Payer: 59 | Admitting: Family Medicine

## 2019-06-16 ENCOUNTER — Encounter: Payer: Self-pay | Admitting: Family Medicine

## 2019-06-16 VITALS — Ht 66.0 in | Wt 195.0 lb

## 2019-06-16 DIAGNOSIS — F339 Major depressive disorder, recurrent, unspecified: Secondary | ICD-10-CM | POA: Diagnosis not present

## 2019-06-16 DIAGNOSIS — R635 Abnormal weight gain: Secondary | ICD-10-CM | POA: Diagnosis not present

## 2019-06-16 DIAGNOSIS — Z3009 Encounter for other general counseling and advice on contraception: Secondary | ICD-10-CM

## 2019-06-16 DIAGNOSIS — F419 Anxiety disorder, unspecified: Secondary | ICD-10-CM | POA: Diagnosis not present

## 2019-06-16 DIAGNOSIS — G4700 Insomnia, unspecified: Secondary | ICD-10-CM | POA: Diagnosis not present

## 2019-06-16 MED ORDER — HYDROXYZINE HCL 25 MG PO TABS: 25.0000 mg | ORAL_TABLET | Freq: Three times a day (TID) | ORAL | 0 refills | Status: DC | PRN

## 2019-06-16 NOTE — Progress Notes (Signed)
Ht 5\' 6"  (1.676 m)   Wt 195 lb (88.5 kg)   BMI 31.47 kg/m    Subjective:    Patient ID: , female    DOB: 1996/01/11, 23 y.o.   MRN: [id number]  HPI: Brittany Vega is a 24 y.o. female  Chief Complaint  Patient presents with  . Constipation    pt would like to try something diferent than the implant  . Insomnia    . This visit was completed via MyChart due to the restrictions of the COVID-19 pandemic. All issues as above were discussed and addressed. Physical exam was done as above through visual confirmation on MyChart. If it was felt that the patient should be evaluated in the office, they were directed there. The patient verbally consented to this visit. . Location of the patient: home . Location of the provider: work . Those involved with this call:  . Provider: 36, PA-C . CMA: Roosvelt Maser, CMA . Front Desk/Registration: Elton Sin  . Time spent on call: 25 minutes with patient face to face via video conference. More than 50% of this time was spent in counseling and coordination of care. 5 minutes total spent in review of patient's record and preparation of their chart. I verified patient identity using two factors (patient name and date of birth). Patient consents verbally to being seen via telemedicine visit today.   Presenting today with multiple concerns. Wanting to discuss changing birth control. Has been on nexplanon for about 5 years now. Looking into copper IUD because she is wanting to move away from hormones if possible. Has gained about 30 lb the past year since getting on the effexor, and knows her birth control can worsen weight gain additionally. Does not feel she's made significant lifestyle changes to cause this type of weight gain.   Sleep is also a struggle anymore for her. Mind races at night, waking often overnight. Tried sleep hygiene practices with no relief.   Depression screen [facility] 2/9 06/16/2019 03/15/2019 02/03/2019  Decreased  Interest 1 1 1   Down, Depressed, Hopeless 1 1 1   PHQ - 2 Score 2 2 2   Altered sleeping 2 2 2   Tired, decreased energy 1 1 1   Change in appetite 0 0 0  Feeling bad or failure about yourself  1 1 1   Trouble concentrating 1 3 1   Moving slowly or fidgety/restless 0 0 0  Suicidal thoughts 0 0 0  PHQ-9 Score 7 9 7   Difficult doing work/chores Somewhat difficult - -   GAD 7 : Generalized Anxiety Score 06/16/2019 03/15/2019 02/03/2019 01/06/2019  Nervous, Anxious, on Edge 1 1 1 2   Control/stop worrying 0 1 0 1  Worry too much - different things 1 1 0 1  Trouble relaxing 1 1 1 1   Restless 0 0 0 0  Easily annoyed or irritable 1 1 1 2   Afraid - awful might happen 0 0 0 1  Total GAD 7 Score 4 5 3 8   Anxiety Difficulty Somewhat difficult Very difficult Somewhat difficult Somewhat difficult     Relevant past medical, surgical, family and social history reviewed and updated as indicated. Interim medical history since our last visit reviewed. Allergies and medications reviewed and updated.  Review of Systems  Per HPI unless specifically indicated above     Objective:    Ht 5\' 6"  (1.676 m)   Wt 195 lb (88.5 kg)   BMI 31.47 kg/m   Wt Readings from Last 3  Encounters:  06/16/19 195 lb (88.5 kg)  03/15/19 199 lb (90.3 kg)  02/03/19 194 lb (88 kg)    Physical Exam Vitals and nursing note reviewed.  Constitutional:      General: She is not in acute distress.    Appearance: Normal appearance.  HENT:     Head: Atraumatic.     Right Ear: External ear normal.     Left Ear: External ear normal.     Nose: Nose normal. No congestion.     Mouth/Throat:     Mouth: Mucous membranes are moist.     Pharynx: Oropharynx is clear. No posterior oropharyngeal erythema.  Eyes:     Extraocular Movements: Extraocular movements intact.     Conjunctiva/sclera: Conjunctivae normal.  Cardiovascular:     Comments: Unable to assess via virtual visit Pulmonary:     Effort: Pulmonary effort is normal. No  respiratory distress.  Musculoskeletal:        General: Normal range of motion.     Cervical back: Normal range of motion.  Skin:    General: Skin is dry.     Findings: No erythema.  Neurological:     Mental Status: She is alert and oriented to person, place, and time.  Psychiatric:        Thought Content: Thought content normal.        Judgment: Judgment normal.     Comments: Tearful during exam     Results for orders placed or performed in visit on 03/29/19  Hepatitis B Surface AntiBODY  Result Value Ref Range   Hep B Surface Ab, Qual Reactive   Measles/Mumps/Rubella Immunity  Result Value Ref Range   Rubella Antibodies, IGG 1.82 Immune >0.99 index   RUBEOLA AB, IGG 135.0 Immune >16.4 AU/mL   MUMPS ABS, IGG <9.0 (L) Immune >10.9 AU/mL  Varicella zoster antibody, IgG  Result Value Ref Range   Varicella zoster IgG 365 Immune >165 index      Assessment & Plan:   Problem List Items Addressed This Visit      Other   Anxiety    Hydroxyzine prn in addition to current regimen      Relevant Medications   hydrOXYzine (ATARAX/VISTARIL) 25 MG tablet   Depression, recurrent ([facility]) - Primary    Concerned about the weight gain side effects with her medications. Wanting to taper off effexor to see if this helps. Continue current regimen as before otherwise      Relevant Medications   hydrOXYzine (ATARAX/VISTARIL) 25 MG tablet   Insomnia    Trial hydroxyzine prn for anxiety and sleep. Sleep hygiene reviewed       Other Visit Diagnoses    Encounter for other general counseling or advice on contraception       Discuss options with GYN    Weight gain       Diet, exercise, consider med adjustments. Continue to monitor       Follow up plan: Return in about 4 weeks (around 07/14/2019) for sleep, weight f/u.

## 2019-06-16 NOTE — Patient Instructions (Signed)
Taper off effexor, start hydroxyzine as needed for sleep and/or anxiety episodes.

## 2019-06-21 ENCOUNTER — Encounter: Payer: Self-pay | Admitting: Family Medicine

## 2019-06-21 DIAGNOSIS — G4700 Insomnia, unspecified: Secondary | ICD-10-CM | POA: Insufficient documentation

## 2019-06-21 NOTE — Assessment & Plan Note (Addendum)
Concerned about the weight gain side effects with her medications. Wanting to taper off effexor to see if this helps. Continue current regimen as before otherwise

## 2019-06-21 NOTE — Assessment & Plan Note (Signed)
Hydroxyzine prn in addition to current regimen

## 2019-06-21 NOTE — Assessment & Plan Note (Signed)
Trial hydroxyzine prn for anxiety and sleep. Sleep hygiene reviewed

## 2019-07-07 DIAGNOSIS — F4310 Post-traumatic stress disorder, unspecified: Secondary | ICD-10-CM | POA: Diagnosis not present

## 2019-07-07 DIAGNOSIS — F321 Major depressive disorder, single episode, moderate: Secondary | ICD-10-CM | POA: Diagnosis not present

## 2019-07-07 DIAGNOSIS — F411 Generalized anxiety disorder: Secondary | ICD-10-CM | POA: Diagnosis not present

## 2019-07-14 DIAGNOSIS — F411 Generalized anxiety disorder: Secondary | ICD-10-CM | POA: Diagnosis not present

## 2019-07-14 DIAGNOSIS — F4310 Post-traumatic stress disorder, unspecified: Secondary | ICD-10-CM | POA: Diagnosis not present

## 2019-07-14 DIAGNOSIS — F321 Major depressive disorder, single episode, moderate: Secondary | ICD-10-CM | POA: Diagnosis not present

## 2019-07-21 DIAGNOSIS — F321 Major depressive disorder, single episode, moderate: Secondary | ICD-10-CM | POA: Diagnosis not present

## 2019-07-21 DIAGNOSIS — F4310 Post-traumatic stress disorder, unspecified: Secondary | ICD-10-CM | POA: Diagnosis not present

## 2019-07-21 DIAGNOSIS — F411 Generalized anxiety disorder: Secondary | ICD-10-CM | POA: Diagnosis not present

## 2019-07-22 DIAGNOSIS — F411 Generalized anxiety disorder: Secondary | ICD-10-CM | POA: Diagnosis not present

## 2019-07-22 DIAGNOSIS — F321 Major depressive disorder, single episode, moderate: Secondary | ICD-10-CM | POA: Diagnosis not present

## 2019-07-22 DIAGNOSIS — F4310 Post-traumatic stress disorder, unspecified: Secondary | ICD-10-CM | POA: Diagnosis not present

## 2019-07-29 DIAGNOSIS — F321 Major depressive disorder, single episode, moderate: Secondary | ICD-10-CM | POA: Diagnosis not present

## 2019-07-29 DIAGNOSIS — F4310 Post-traumatic stress disorder, unspecified: Secondary | ICD-10-CM | POA: Diagnosis not present

## 2019-07-29 DIAGNOSIS — F411 Generalized anxiety disorder: Secondary | ICD-10-CM | POA: Diagnosis not present

## 2019-08-04 ENCOUNTER — Encounter: Payer: Self-pay | Admitting: Family Medicine

## 2019-08-04 ENCOUNTER — Ambulatory Visit (INDEPENDENT_AMBULATORY_CARE_PROVIDER_SITE_OTHER): Payer: 59 | Admitting: Family Medicine

## 2019-08-04 ENCOUNTER — Other Ambulatory Visit: Payer: Self-pay

## 2019-08-04 VITALS — BP 116/76 | HR 92 | Temp 98.3°F | Wt 208.2 lb

## 2019-08-04 DIAGNOSIS — F339 Major depressive disorder, recurrent, unspecified: Secondary | ICD-10-CM | POA: Diagnosis not present

## 2019-08-04 DIAGNOSIS — R635 Abnormal weight gain: Secondary | ICD-10-CM

## 2019-08-04 DIAGNOSIS — F419 Anxiety disorder, unspecified: Secondary | ICD-10-CM | POA: Diagnosis not present

## 2019-08-04 MED ORDER — BUPROPION HCL ER (XL) 150 MG PO TB24: 150.0000 mg | ORAL_TABLET | Freq: Every day | ORAL | 0 refills | Status: DC

## 2019-08-04 NOTE — Assessment & Plan Note (Signed)
Patient self d/c'd medications due to weight gain, and significantly exacerbated since d/c. Will trial wellbutrin due to low chance of weight gain and place urgent referral to Psychiatry for further management given severity 

## 2019-08-04 NOTE — Progress Notes (Signed)
BP 116/76 (BP Location: Right Arm, Patient Position: Sitting, Cuff Size: Normal)   Pulse 92   Temp 98.3 F (36.8 C) (Oral)   Wt 208 lb 3.2 oz (94.4 kg)   SpO2 98%   BMI 33.60 kg/m    Subjective:    Patient ID: Brittany Vega, female    DOB: [birth year], 24 y.o.   MRN: [id number]  HPI: Brittany Vega is a 24 y.o. female  Chief Complaint  Patient presents with  . Depression  . Anxiety  . Insomnia  . Weight Check   Patient presenting today for anxiety and depression follow up. Was doing fairly well on effexor and abilify last year initially, but then since has gained 30 lb and started feeling the medicines weren't as effective so paired with the weight gain effects stopped all 3 (including hydroxyzine) last month. 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 In a very low point mentally right now, though not suicidal or homicidal.   Depression screen [facility] 2/9 06/16/2019 03/15/2019 02/03/2019  Decreased Interest 1 1 1   Down, Depressed, Hopeless 1 1 1   PHQ - 2 Score 2 2 2   Altered sleeping 2 2 2   Tired, decreased energy 1 1 1   Change in appetite 0 0 0  Feeling bad or failure about yourself  1 1 1   Trouble concentrating 1 3 1   Moving slowly or fidgety/restless 0 0 0  Suicidal thoughts 0 0 0  PHQ-9 Score 7 9 7   Difficult doing work/chores Somewhat difficult - -   GAD 7 : Generalized Anxiety Score 06/16/2019 03/15/2019 02/03/2019 01/06/2019  Nervous, Anxious, on Edge 1 1 1 2   Control/stop worrying 0 1 0 1  Worry too much - different things 1 1 0 1  Trouble relaxing 1 1 1 1   Restless 0 0 0 0  Easily annoyed or irritable 1 1 1 2   Afraid - awful might happen 0 0 0 1  Total GAD 7 Score 4 5 3 8   Anxiety Difficulty Somewhat difficult Very difficult Somewhat difficult Somewhat difficult   Relevant past medical, surgical, family and social history reviewed and updated as  indicated. Interim medical history since our last visit reviewed. Allergies and medications reviewed and updated.  Review of Systems  Per HPI unless specifically indicated above     Objective:    BP 116/76 (BP Location: Right Arm, Patient Position: Sitting, Cuff Size: Normal)   Pulse 92   Temp 98.3 F (36.8 C) (Oral)   Wt 208 lb 3.2 oz (94.4 kg)   SpO2 98%   BMI 33.60 kg/m   Wt Readings from Last 3 Encounters:  08/04/19 208 lb 3.2 oz (94.4 kg)  06/16/19 195 lb (88.5 kg)  03/15/19 199 lb (90.3 kg)    Physical Exam Vitals and nursing note reviewed.  Constitutional:      Appearance: Normal appearance. She is not ill-appearing.  HENT:     Head: Atraumatic.  Eyes:     Extraocular Movements: Extraocular movements intact.     Conjunctiva/sclera: Conjunctivae normal.  Cardiovascular:     Rate and Rhythm: Normal rate and regular rhythm.     Heart sounds: Normal heart sounds.  Pulmonary:     Effort: Pulmonary effort is normal.     Breath sounds: Normal breath sounds.  Musculoskeletal:        General: Normal range of motion.  Cervical back: Normal range of motion and neck supple.  Skin:    General: Skin is warm and dry.  Neurological:     Mental Status: She is alert and oriented to person, place, and time.  Psychiatric:        Thought Content: Thought content normal.        Judgment: Judgment normal.     Comments: Tearful, very depressed affect    Results for orders placed or performed in visit on 03/29/19  Hepatitis B Surface AntiBODY  Result Value Ref Range   Hep B Surface Ab, Qual Reactive   Measles/Mumps/Rubella Immunity  Result Value Ref Range   Rubella Antibodies, IGG 1.82 Immune >0.99 index   RUBEOLA AB, IGG 135.0 Immune >16.4 AU/mL   MUMPS ABS, IGG <9.0 (L) Immune >10.9 AU/mL  Varicella zoster antibody, IgG  Result Value Ref Range   Varicella zoster IgG 365 Immune >165 index      Assessment & Plan:   Problem List Items Addressed This Visit       Other   Anxiety    Patient self d/c'd medications due to weight gain, and significantly exacerbated since d/c. Will trial wellbutrin due to low chance of weight gain and place urgent referral to Psychiatry for further management given severity      Relevant Medications   buPROPion (WELLBUTRIN XL) 150 MG 24 hr tablet   Other Relevant Orders   Ambulatory referral to Psychiatry   Depression, recurrent (HCC) - Primary    Patient self d/c'd medications due to weight gain, and significantly exacerbated since d/c. Will trial wellbutrin due to low chance of weight gain and place urgent referral to Psychiatry for further management given severity      Relevant Medications   buPROPion (WELLBUTRIN XL) 150 MG 24 hr tablet   Other Relevant Orders   Ambulatory referral to Psychiatry    Other Visit Diagnoses    Weight gain       Possibly medication side effect, leading to d/c of medications and exacerbation of mood/anxiety. Trial wellbutrin, continue diet exercise changes       Follow up plan: Return in about 4 weeks (around 09/01/2019) for Mood f/u if not in with Psychiatry yet.

## 2019-08-04 NOTE — Assessment & Plan Note (Signed)
Patient self d/c'd medications due to weight gain, and significantly exacerbated since d/c. Will trial wellbutrin due to low chance of weight gain and place urgent referral to Psychiatry for further management given severity

## 2019-08-12 DIAGNOSIS — F4310 Post-traumatic stress disorder, unspecified: Secondary | ICD-10-CM | POA: Diagnosis not present

## 2019-08-12 DIAGNOSIS — F411 Generalized anxiety disorder: Secondary | ICD-10-CM | POA: Diagnosis not present

## 2019-08-12 DIAGNOSIS — F321 Major depressive disorder, single episode, moderate: Secondary | ICD-10-CM | POA: Diagnosis not present

## 2019-08-17 IMAGING — DX RIGHT FOOT - 2 VIEW
2 series · 2 of 2 positions shown · non-contrast
Comparison: None.

CLINICAL DATA: Twisting injury

EXAM:
RIGHT ANKLE - 2 VIEW; RIGHT FOOT - 2 VIEW

[foot ap]
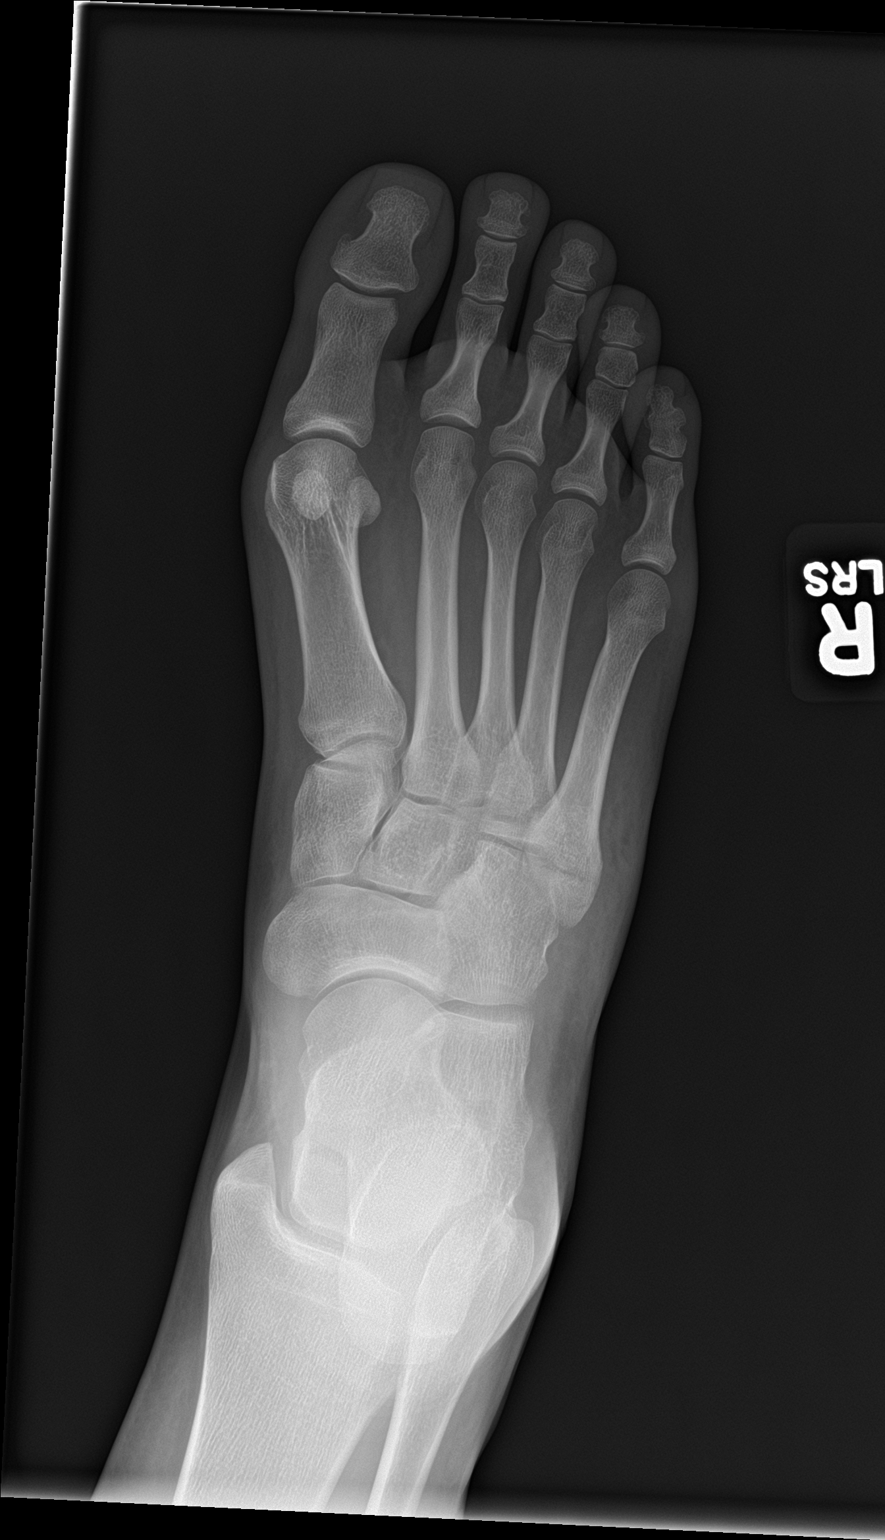

[foot lat]
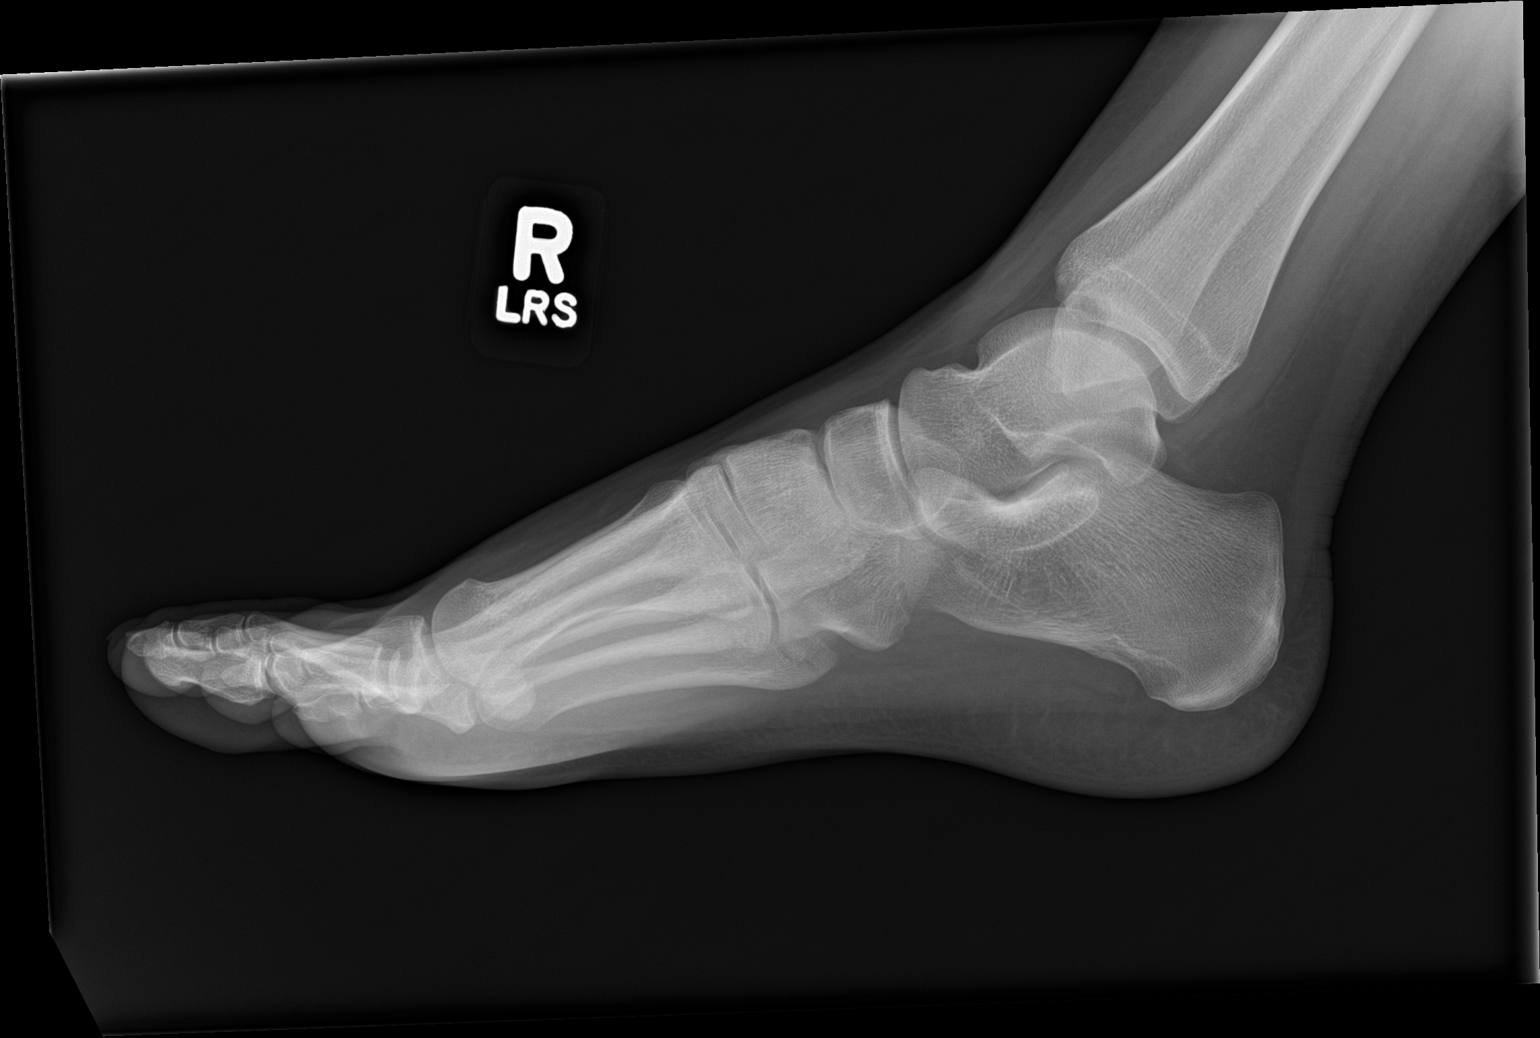

[2 of 2 positions shown; findings below may reference images not displayed]

FINDINGS: There is a subtle, nondisplaced transverse fracture of the base of
the right fifth metatarsal. No other fracture or dislocation of the
right foot or ankle. Joint spaces are preserved. Soft tissue edema
about the lateral foot.
IMPRESSION: There is a subtle, nondisplaced transverse fracture of the base of
the right fifth metatarsal. No other fracture or dislocation of the
right foot or ankle. Joint spaces are preserved. Soft tissue edema
about the lateral foot.

## 2019-08-17 IMAGING — DX RIGHT ANKLE - 2 VIEW
2 series · 2 of 2 positions shown · non-contrast
Comparison: None.

CLINICAL DATA: Twisting injury

EXAM:
RIGHT ANKLE - 2 VIEW; RIGHT FOOT - 2 VIEW

[ankle ap]
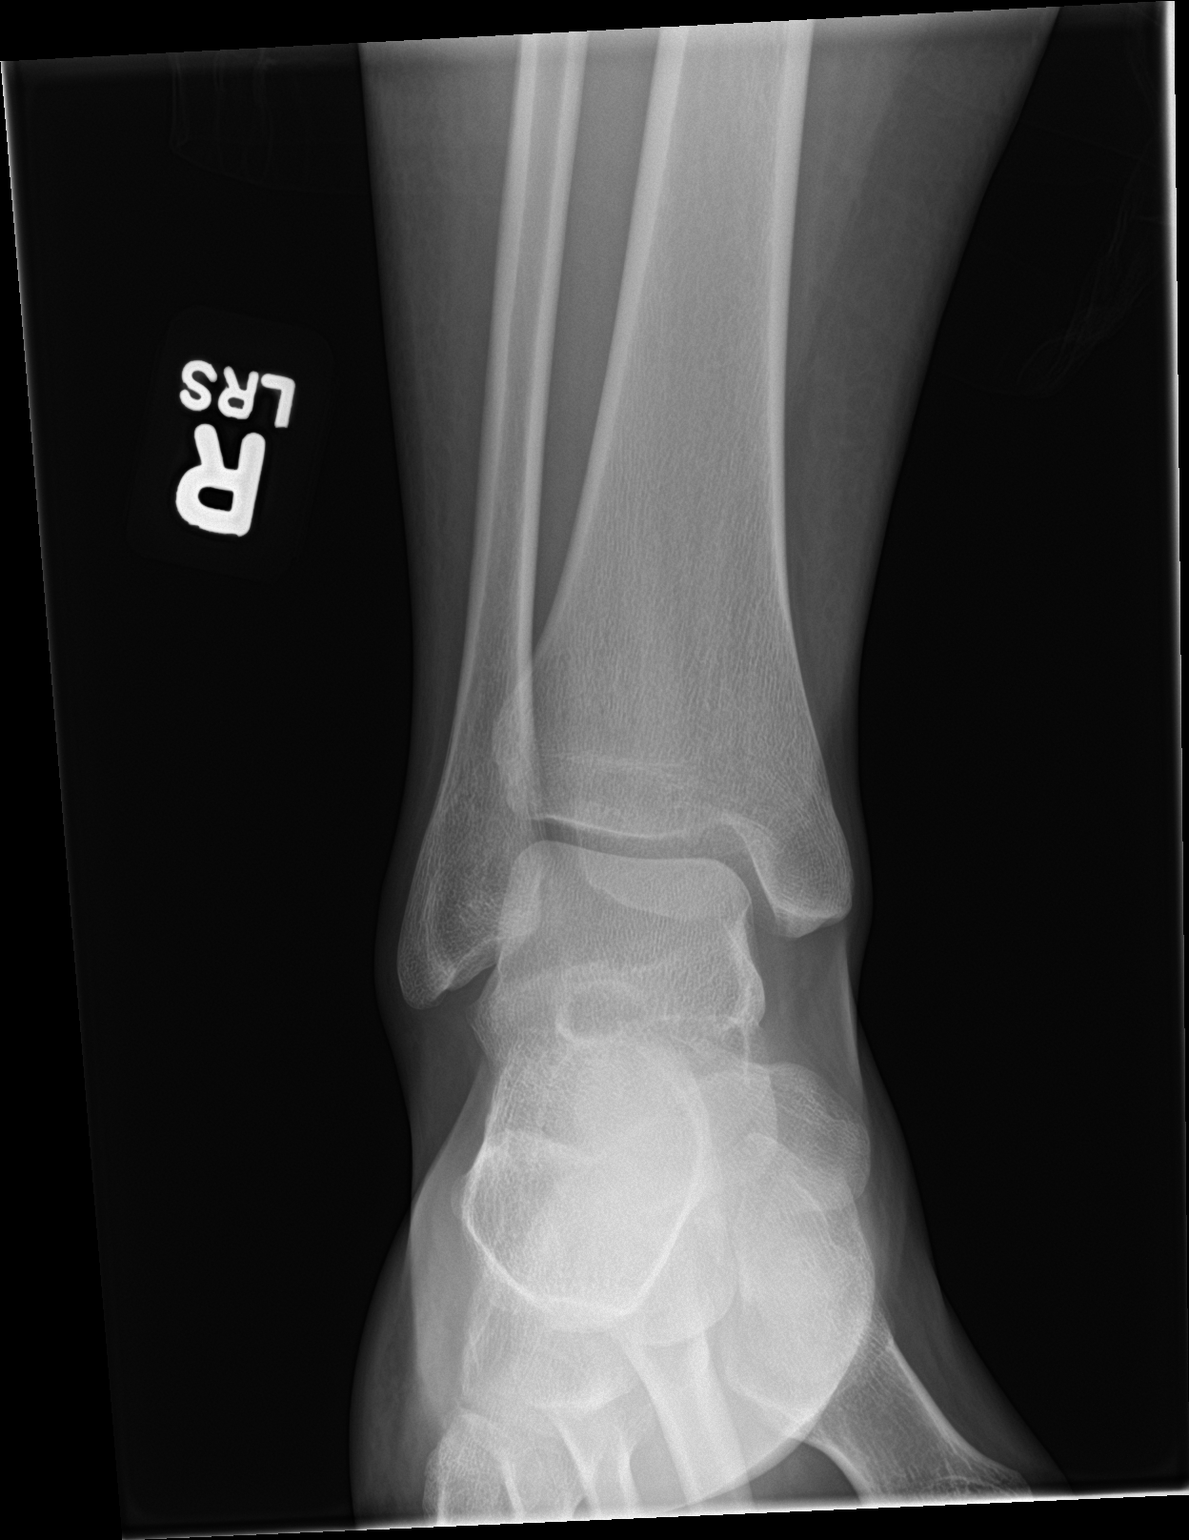

[ankle lat]
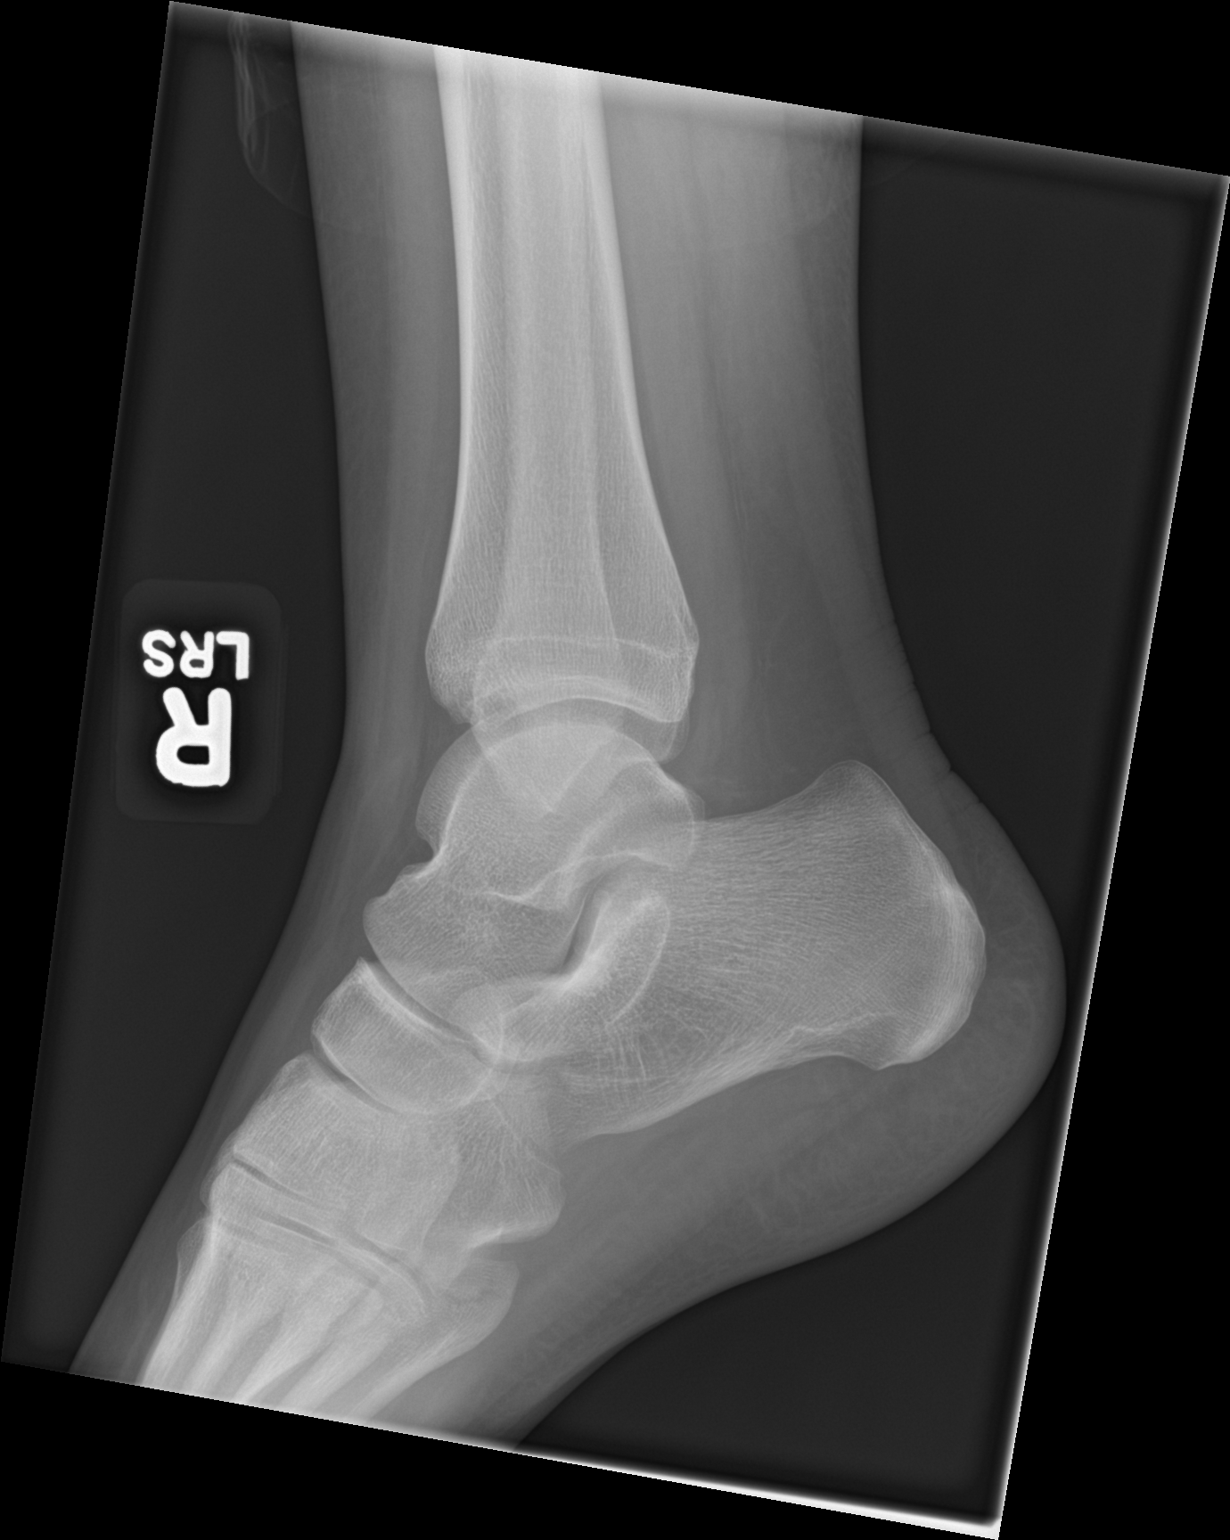

[2 of 2 positions shown; findings below may reference images not displayed]

FINDINGS: There is a subtle, nondisplaced transverse fracture of the base of
the right fifth metatarsal. No other fracture or dislocation of the
right foot or ankle. Joint spaces are preserved. Soft tissue edema
about the lateral foot.
IMPRESSION: There is a subtle, nondisplaced transverse fracture of the base of
the right fifth metatarsal. No other fracture or dislocation of the
right foot or ankle. Joint spaces are preserved. Soft tissue edema
about the lateral foot.

## 2019-08-18 DIAGNOSIS — F332 Major depressive disorder, recurrent severe without psychotic features: Secondary | ICD-10-CM | POA: Diagnosis not present

## 2019-08-18 DIAGNOSIS — F4310 Post-traumatic stress disorder, unspecified: Secondary | ICD-10-CM | POA: Diagnosis not present

## 2019-08-18 DIAGNOSIS — F17200 Nicotine dependence, unspecified, uncomplicated: Secondary | ICD-10-CM | POA: Diagnosis not present

## 2019-08-18 DIAGNOSIS — F321 Major depressive disorder, single episode, moderate: Secondary | ICD-10-CM | POA: Diagnosis not present

## 2019-08-18 DIAGNOSIS — F411 Generalized anxiety disorder: Secondary | ICD-10-CM | POA: Diagnosis not present

## 2019-08-18 DIAGNOSIS — F4312 Post-traumatic stress disorder, chronic: Secondary | ICD-10-CM | POA: Diagnosis not present

## 2019-08-19 DIAGNOSIS — F4310 Post-traumatic stress disorder, unspecified: Secondary | ICD-10-CM | POA: Diagnosis not present

## 2019-08-19 DIAGNOSIS — F411 Generalized anxiety disorder: Secondary | ICD-10-CM | POA: Diagnosis not present

## 2019-08-19 DIAGNOSIS — F321 Major depressive disorder, single episode, moderate: Secondary | ICD-10-CM | POA: Diagnosis not present

## 2019-09-08 DIAGNOSIS — F332 Major depressive disorder, recurrent severe without psychotic features: Secondary | ICD-10-CM | POA: Diagnosis not present

## 2019-09-08 DIAGNOSIS — F4312 Post-traumatic stress disorder, chronic: Secondary | ICD-10-CM | POA: Diagnosis not present

## 2019-09-08 DIAGNOSIS — F411 Generalized anxiety disorder: Secondary | ICD-10-CM | POA: Diagnosis not present

## 2019-10-06 DIAGNOSIS — F4312 Post-traumatic stress disorder, chronic: Secondary | ICD-10-CM | POA: Diagnosis not present

## 2019-10-06 DIAGNOSIS — F332 Major depressive disorder, recurrent severe without psychotic features: Secondary | ICD-10-CM | POA: Diagnosis not present

## 2019-10-06 DIAGNOSIS — F411 Generalized anxiety disorder: Secondary | ICD-10-CM | POA: Diagnosis not present

## 2020-01-04 ENCOUNTER — Ambulatory Visit (INDEPENDENT_AMBULATORY_CARE_PROVIDER_SITE_OTHER): Payer: 59 | Admitting: Nurse Practitioner

## 2020-01-04 ENCOUNTER — Encounter: Payer: Self-pay | Admitting: Nurse Practitioner

## 2020-01-04 ENCOUNTER — Other Ambulatory Visit: Payer: Self-pay

## 2020-01-04 VITALS — Ht 66.0 in | Wt 195.0 lb

## 2020-01-04 DIAGNOSIS — F419 Anxiety disorder, unspecified: Secondary | ICD-10-CM

## 2020-01-04 MED ORDER — BUSPIRONE HCL 30 MG PO TABS: 30.0000 mg | ORAL_TABLET | Freq: Every day | ORAL | 1 refills | Status: DC

## 2020-01-04 MED ORDER — BUPROPION HCL ER (XL) 150 MG PO TB24
150.0000 mg | ORAL_TABLET | Freq: Every day | ORAL | 1 refills | Status: DC
Start: 2020-01-04 — End: 2020-06-28

## 2020-01-04 MED ORDER — BUPROPION HCL ER (XL) 300 MG PO TB24: 300.0000 mg | ORAL_TABLET | Freq: Every day | ORAL | 1 refills | Status: DC

## 2020-01-04 NOTE — Progress Notes (Signed)
MyChart Video Visit    Virtual Visit via Video Note   I discussed the limitations of evaluation and management by telemedicine and the availability of in person appointments. The patient expressed understanding and agreed to proceed.  Patient: Brittany Vega   DOB: 07-25-1995   24 y.o. Female  MRN: [id number] Visit Date: 01/04/2020  Today's healthcare provider: Valentino Nose, NP   Chief Complaint  Patient presents with  . Anxiety   Subjective    HPI  Anxiety, Follow-up  She was last seen for anxiety 5 months ago. Changes made at last visit include start Wellbutrin and referred to psychiatry.  Since last visit, Psychiatry has increased Wellbutrin to 450 mg daily and added 30 mg of buspirone daily at nighttime.  Patient reports worried that her insurance ends at the end of this month and is requesting a 64-month supply of these medications.  She reports excellent compliance with treatment. She reports excellent tolerance of treatment. She is not having side effects.   She feels her anxiety is mild and Improved since last visit.  Symptoms: No chest pain No difficulty concentrating  No dizziness No fatigue  No feelings of losing control No insomnia  No irritable No palpitations  No panic attacks No racing thoughts  No shortness of breath No sweating  No tremors/shakes    GAD-7 Results GAD-7 Generalized Anxiety Disorder Screening Tool 01/04/2020 06/16/2019 03/15/2019  1. Feeling Nervous, Anxious, or on Edge 1 1 1   2. Not Being Able to Stop or Control Worrying 1 0 1  3. Worrying Too Much About Different Things 1 1 1   4. Trouble Relaxing 0 1 1  5. Being So Restless it's Hard To Sit Still 0 0 0  6. Becoming Easily Annoyed or Irritable 0 1 1  7. Feeling Afraid As If Something Awful Might Happen 1 0 0  Total GAD-7 Score 4 4 5   Difficulty At Work, Home, or Getting  Along With Others? Somewhat difficult Somewhat difficult Very difficult    PHQ-9 Scores PHQ9 SCORE ONLY  01/04/2020 06/16/2019 03/15/2019  PHQ-9 Total Score 5 7 9     ---------------------------------------------------------------------------------------------------  Patient Active Problem List   Diagnosis Date Noted  . Insomnia 06/21/2019  . Anxiety 06/17/2018  . Depression, recurrent (HCC) 06/17/2018   Social History   Tobacco Use  . Smoking status: Never Smoker  . Smokeless tobacco: Never Used  Vaping Use  . Vaping Use: Every day  . Substances: Nicotine, Flavoring  Substance Use Topics  . Alcohol use: No  . Drug use: No   Allergies  Allergen Reactions  . Seroquel [Quetiapine Fumarate] Other (See Comments)    Restless legs      Medications: Outpatient Medications Prior to Visit  Medication Sig  . etonogestrel (NEXPLANON) 68 MG IMPL implant 1 each by Subdermal route once. Placed April 2019  . [DISCONTINUED] buPROPion (WELLBUTRIN XL) 150 MG 24 hr tablet Take 1 tablet (150 mg total) by mouth daily. (Patient taking differently: Take 450 mg by mouth daily. )  . [DISCONTINUED] busPIRone (BUSPAR) 30 MG tablet Take 30 mg by mouth at bedtime.  . [DISCONTINUED] ARIPiprazole (ABILIFY) 5 MG tablet Take 1 tablet (5 mg total) by mouth daily. (Patient not taking: Reported on 08/04/2019)  . [DISCONTINUED] hydrOXYzine (ATARAX/VISTARIL) 25 MG tablet Take 1 tablet (25 mg total) by mouth 3 (three) times daily as needed. (Patient not taking: Reported on 08/04/2019)  . [DISCONTINUED] venlafaxine XR (EFFEXOR XR) 150 MG 24 hr capsule Take  1 capsule (150 mg total) by mouth daily with breakfast. (Patient not taking: Reported on 08/04/2019)   No facility-administered medications prior to visit.    Review of Systems  Constitutional: Positive for fatigue. Negative for activity change, appetite change, fever and unexpected weight change.  Respiratory: Negative.  Negative for cough, chest tightness and shortness of breath.   Cardiovascular: Negative.  Negative for chest pain and palpitations.  Skin:  Negative.   Psychiatric/Behavioral: Positive for sleep disturbance. Negative for agitation, behavioral problems and suicidal ideas. The patient is not nervous/anxious.     Objective    Ht 5\' 6"  (1.676 m)   Wt 195 lb (88.5 kg)   BMI 31.47 kg/m  BP Readings from Last 3 Encounters:  08/04/19 116/76  03/15/19 115/74  02/03/19 129/79   Wt Readings from Last 3 Encounters:  01/04/20 195 lb (88.5 kg)  08/04/19 208 lb 3.2 oz (94.4 kg)  06/16/19 195 lb (88.5 kg)    Physical Exam Vitals and nursing note reviewed.  Constitutional:      General: She is not in acute distress.    Appearance: Normal appearance. She is not toxic-appearing.  HENT:     Head: Normocephalic and atraumatic.     Right Ear: External ear normal.     Left Ear: External ear normal.  Eyes:     General: No scleral icterus.    Extraocular Movements: Extraocular movements intact.  Cardiovascular:     Comments: Unable to assess heart sounds via virtual visit. Pulmonary:     Effort: Pulmonary effort is normal. No respiratory distress.     Comments: Unable to assess lung sounds via virtual visit; patient talking in complete sentences. Abdominal:     Comments: Unable to assess bowel sounds via virtual visit  Skin:    Coloration: Skin is not jaundiced or pale.     Findings: No erythema.  Neurological:     Mental Status: She is alert and oriented to person, place, and time.  Psychiatric:        Mood and Affect: Mood normal.        Behavior: Behavior normal.        Thought Content: Thought content normal.        Judgment: Judgment normal.      Assessment & Plan    1. Anxiety Chronic, well controlled.  We will send in refills of Wellbutrin 450 mg extended release and buspirone 30 mg.  Will provide with good Rx card for medication affordability after insurance ends.  Patient encouraged to continue to follow with 08/16/19, psychiatry, and or therapy.  Patient encouraged to reach out to [place] if needs arise.  Follow-up in 6  months.   Return in about 6 months (around 07/03/2020) for mood f/u.     Due to the catastrophic nature of the COVID-19 pandemic, this visit was completed via audio and visual contact via [employer] due to the restrictions of the COVID-19 pandemic. All issues as above were discussed and addressed. Physical exam was done as above through visual confirmation on [employer]. If it was felt that the patient should be evaluated in the office, they were directed there. The patient verbally consented to this visit."} . Location of the patient: home . Location of the provider: work . Those involved with this call:  . Provider: 07/05/2020, DNP . CMA: Mardene Celeste, Rondel Baton . Front Desk/Registration: [facility]  . Time spent on call: 15 minutes with patient face to face via video conference. More than 50%  of this time was spent in counseling and coordination of care. 30 minutes total spent in review of patient's record and preparation of their chart.  I verified patient identity using two factors (patient name and date of birth). Patient consents verbally to being seen via telemedicine visit today.  I have reviewed this encounter including the documentation in this note. I am certifying that I agree with the content of this note as primary care provider.   Valentino Nose, NP [facility] [phone] (phone) [phone] (fax)  [facility]

## 2020-01-04 NOTE — Patient Instructions (Signed)

## 2020-01-04 NOTE — Assessment & Plan Note (Signed)
Chronic, well controlled.  We will send in refills of Wellbutrin 450 mg extended release and buspirone 30 mg.  Will provide with good Rx card for medication affordability after insurance ends.  Patient encouraged to continue to follow with [place], psychiatry, and/or therapy.  Patient encouraged to reach out to [place] if needs arise.  Follow-up in 6 months.

## 2020-01-09 ENCOUNTER — Encounter: Payer: Self-pay | Admitting: Nurse Practitioner

## 2020-06-27 NOTE — Progress Notes (Signed)
BP 114/75   Pulse 77   Temp 98.1 F (36.7 C)   Wt 195 lb (88.5 kg)   SpO2 99%   BMI 31.47 kg/m    Subjective:    Patient ID: Brittany Vega, female    DOB: 1996-03-26, 25 y.o.   MRN: [id number]  HPI: Brittany Vega is a 25 y.o. female  Chief Complaint  Patient presents with  . Anxiety  . Depression   DEPRESSION Mood status: controlled Satisfied with current treatment?: yes Symptom severity: mild  Duration of current treatment : years Side effects: no Medication compliance: excellent compliance Psychotherapy/counseling: no in the past Previous psychiatric medications: effexor and seroquel Depressed mood: no Anxious mood: yes Anhedonia: no Significant weight loss or gain: no Insomnia: yes hard to stay asleep Fatigue: no Feelings of worthlessness or guilt: no Impaired concentration/indecisiveness: no Suicidal ideations: no Hopelessness: yes Crying spells: yes Depression screen [facility] 2/9 06/28/2020 01/04/2020 06/16/2019 03/15/2019 02/03/2019  Decreased Interest 0 0 _0 Down, Depressed, Hopeless 1 0 _1 PHQ - 2 Score 1 0 _2 Altered sleeping _3 Tired, decreased energy 0 _4 Change in appetite 0 0 0 0 0  Feeling bad or failure about yourself  _5 Trouble concentrating 0 0 _6 Moving slowly or fidgety/restless 0 0 0 0 0  Suicidal thoughts 0 0 0 0 0  PHQ-9 Score _7 Difficult doing work/chores Not difficult at all Somewhat difficult Somewhat difficult - -   ANXIETY/STRESS Duration:controlled Anxious mood: yes  Excessive worrying: yes Irritability: no  Sweating: no Nausea: no Palpitations:no Hyperventilation: no Panic attacks: no Agoraphobia: no  Obscessions/compulsions: no Depressed mood: no Depression screen [facility] 2/9 06/28/2020 01/04/2020 06/16/2019 03/15/2019 02/03/2019  Decreased Interest 0 0 _8 Down, Depressed, Hopeless 1 0 _9 PHQ - 2 Score 1 0 _10 Altered sleeping _11 Tired, decreased energy 0 _12 Change in appetite 0 0 0 0 0  Feeling bad or failure about yourself  _13 Trouble concentrating 0 0 _14 Moving slowly or fidgety/restless 0 0 0 0 0  Suicidal thoughts 0 0 0 0 0  PHQ-9 Score _15 Difficult doing work/chores Not difficult at all Somewhat difficult Somewhat difficult - -   Anhedonia: no Weight changes: no Insomnia: yes hard to stay asleep  Hypersomnia: no Fatigue/loss of energy: no Feelings of worthlessness: no Feelings of guilt: no Impaired concentration/indecisiveness: no Suicidal ideations: no  Crying spells: yes Recent Stressors/Life Changes: no   Relationship problems: no   Family stress: no     Financial stress: no    Job stress: no    Recent death/loss: no  Relevant past medical, surgical, family and social history reviewed and updated as indicated. Interim medical history since our last visit reviewed. Allergies and medications reviewed and updated.  Review of Systems  Psychiatric/Behavioral: Negative for decreased concentration, sleep disturbance and suicidal ideas. The patient is nervous/anxious.     Per HPI unless specifically indicated above     Objective:    BP 114/75   Pulse 77   Temp 98.1 F (36.7 C)   Wt 195 lb (88.5 kg)   SpO2 99%   BMI 31.47 kg/m  Wt Readings from Last 3 Encounters:  06/28/20 195 lb (88.5 kg)  01/04/20 195 lb (88.5 kg)  08/04/19 208 lb 3.2 oz (94.4 kg)    Physical Exam Vitals and nursing note reviewed.  Constitutional:      General: She is not in acute distress.    Appearance: Normal appearance. She is normal weight. She is not ill-appearing, toxic-appearing or diaphoretic.  HENT:     Head: Normocephalic.     Right Ear: External ear normal.     Left Ear: External ear normal.     Nose: Nose normal.     Mouth/Throat:     Mouth: Mucous membranes are moist.     Pharynx: Oropharynx is clear.  Eyes:     General:        Right eye: No discharge.        Left eye: No discharge.      Extraocular Movements: Extraocular movements intact.     Conjunctiva/sclera: Conjunctivae normal.     Pupils: Pupils are equal, round, and reactive to light.  Cardiovascular:     Rate and Rhythm: Normal rate and regular rhythm.     Heart sounds: No murmur heard.   Pulmonary:     Effort: Pulmonary effort is normal. No respiratory distress.     Breath sounds: Normal breath sounds. No wheezing or rales.  Musculoskeletal:     Cervical back: Normal range of motion and neck supple.  Skin:    General: Skin is warm and dry.     Capillary Refill: Capillary refill takes less than 2 seconds.  Neurological:     General: No focal deficit present.     Mental Status: She is alert and oriented to person, place, and time. Mental status is at baseline.  Psychiatric:        Mood and Affect: Mood normal.        Behavior: Behavior normal.        Thought Content: Thought content normal.        Judgment: Judgment normal.     Results for orders placed or performed in visit on 03/29/19  Hepatitis B Surface AntiBODY  Result Value Ref Range   Hep B Surface Ab, Qual Reactive   Measles/Mumps/Rubella Immunity  Result Value Ref Range   Rubella Antibodies, IGG 1.82 Immune >0.99 index   RUBEOLA AB, IGG 135.0 Immune >16.4 AU/mL   MUMPS ABS, IGG <9.0 (L) Immune >10.9 AU/mL  Varicella zoster antibody, IgG  Result Value Ref Range   Varicella zoster IgG 365 Immune >165 index      Assessment & Plan:   Problem List Items Addressed This Visit      Other   Anxiety    Chronic. Controlled.  Patient states that the Wellbutrin is working great.  Discussed counseling in the future if anxiety worsens.  Discussed using Buspar during the day if anxiety worsens.  Patient educated on maximum dosing per day.  Denies SI.  Labs ordered today.  Refills sent today.  Follow up in 6 months.      Relevant Medications   buPROPion (WELLBUTRIN XL) 300 MG 24 hr tablet   buPROPion (WELLBUTRIN XL) 150 MG 24 hr tablet   busPIRone  (BUSPAR) 30 MG tablet   Other Relevant Orders   Comp Met (CMET)   Depression, recurrent ([facility]) - Primary    Chronic. Controlled.  Patient states that the Wellbutrin is working great. Denies SI.  Labs ordered today.  Refills sent today.  Follow up in  6 months.      Relevant Medications   buPROPion (WELLBUTRIN XL) 300 MG 24 hr tablet   buPROPion (WELLBUTRIN XL) 150 MG 24 hr tablet   busPIRone (BUSPAR) 30 MG tablet   Other Relevant Orders   Comp Met (CMET)       Follow up plan: Return in about 6 months (around 12/29/2020) for Physical and Fasting labs.   A total of 30 minutes were spent on this encounter today.  When total time is documented, this includes both the face-to-face and non-face-to-face time personally spent before, during and after the visit on the date of the encounter.

## 2020-06-28 ENCOUNTER — Ambulatory Visit (INDEPENDENT_AMBULATORY_CARE_PROVIDER_SITE_OTHER): Payer: 59 | Admitting: Nurse Practitioner

## 2020-06-28 ENCOUNTER — Other Ambulatory Visit: Payer: Self-pay

## 2020-06-28 ENCOUNTER — Encounter: Payer: Self-pay | Admitting: Nurse Practitioner

## 2020-06-28 VITALS — BP 114/75 | HR 77 | Temp 98.1°F | Wt 195.0 lb

## 2020-06-28 DIAGNOSIS — F339 Major depressive disorder, recurrent, unspecified: Secondary | ICD-10-CM

## 2020-06-28 DIAGNOSIS — F419 Anxiety disorder, unspecified: Secondary | ICD-10-CM | POA: Diagnosis not present

## 2020-06-28 MED ORDER — BUPROPION HCL ER (XL) 300 MG PO TB24: 300.0000 mg | ORAL_TABLET | Freq: Every day | ORAL | 1 refills | Status: DC

## 2020-06-28 MED ORDER — BUPROPION HCL ER (XL) 150 MG PO TB24: 150.0000 mg | ORAL_TABLET | Freq: Every day | ORAL | 1 refills | Status: DC

## 2020-06-28 MED ORDER — BUSPIRONE HCL 30 MG PO TABS: 30.0000 mg | ORAL_TABLET | Freq: Every day | ORAL | 1 refills | Status: DC

## 2020-06-28 NOTE — Assessment & Plan Note (Signed)
Chronic. Controlled.  Patient states that the Wellbutrin is working great.  Discussed counseling in the future if anxiety worsens.  Discussed using Buspar during the day if anxiety worsens.  Patient educated on maximum dosing per day.  Denies SI.  Labs ordered today.  Refills sent today.  Follow up in 6 months.

## 2020-06-28 NOTE — Assessment & Plan Note (Signed)
Chronic. Controlled.  Patient states that the Wellbutrin is working great. Denies SI.  Labs ordered today.  Refills sent today.  Follow up in 6 months.

## 2020-06-29 LAB — COMPREHENSIVE METABOLIC PANEL
ALT: 11 IU/L (ref 0–32)
AST: 15 IU/L (ref 0–40)
Albumin/Globulin Ratio: 2.1 (ref 1.2–2.2)
Albumin: 4.9 g/dL (ref 3.9–5.0)
Alkaline Phosphatase: 69 IU/L (ref 44–121)
BUN/Creatinine Ratio: 13 (ref 9–23)
BUN: 9 mg/dL (ref 6–20)
Bilirubin Total: 0.3 mg/dL (ref 0.0–1.2)
CO2: 22 mmol/L (ref 20–29)
Calcium: 9.7 mg/dL (ref 8.7–10.2)
Chloride: 106 mmol/L (ref 96–106)
Creatinine, Ser: 0.7 mg/dL (ref 0.57–1.00)
Globulin, Total: 2.3 g/dL (ref 1.5–4.5)
Glucose: 87 mg/dL (ref 65–99)
Potassium: 4.4 mmol/L (ref 3.5–5.2)
Sodium: 144 mmol/L (ref 134–144)
Total Protein: 7.2 g/dL (ref 6.0–8.5)
eGFR: 125 mL/min/{1.73_m2} (ref >59)

## 2020-06-29 NOTE — Progress Notes (Signed)
Hi [name].  Your lab work looks great.  Continue with your current medication regimen.  Follow up as discussed.

## 2020-08-04 ENCOUNTER — Ambulatory Visit: Payer: 59 | Admitting: Nurse Practitioner

## 2020-11-09 ENCOUNTER — Encounter: Payer: Self-pay | Admitting: Nurse Practitioner

## 2020-11-10 MED ORDER — BUSPIRONE HCL 30 MG PO TABS: 30.0000 mg | ORAL_TABLET | Freq: Every day | ORAL | 1 refills | Status: DC

## 2021-01-01 ENCOUNTER — Ambulatory Visit (LOCAL_COMMUNITY_HEALTH_CENTER): Payer: 59 | Admitting: Physician Assistant

## 2021-01-01 ENCOUNTER — Other Ambulatory Visit: Payer: Self-pay | Admitting: Nurse Practitioner

## 2021-01-01 ENCOUNTER — Encounter: Payer: Self-pay | Admitting: Physician Assistant

## 2021-01-01 ENCOUNTER — Other Ambulatory Visit: Payer: Self-pay

## 2021-01-01 VITALS — BP 123/76 | Ht 66.0 in | Wt 187.8 lb

## 2021-01-01 DIAGNOSIS — Z0000 Encounter for general adult medical examination without abnormal findings: Secondary | ICD-10-CM | POA: Diagnosis not present

## 2021-01-01 DIAGNOSIS — Z30017 Encounter for initial prescription of implantable subdermal contraceptive: Secondary | ICD-10-CM

## 2021-01-01 DIAGNOSIS — Z3009 Encounter for other general counseling and advice on contraception: Secondary | ICD-10-CM | POA: Diagnosis not present

## 2021-01-01 NOTE — Telephone Encounter (Signed)
Left message for pt to CB, needs appt. #30 courtesy refill provided.

## 2021-01-01 NOTE — Progress Notes (Signed)
Patient here for PE and Nexplanon insertion. Providers orders completed. Condoms declined.

## 2021-01-01 NOTE — Progress Notes (Signed)
See note for IP/RP visit this same date.

## 2021-01-03 ENCOUNTER — Encounter: Payer: Self-pay | Admitting: Physician Assistant

## 2021-01-03 DIAGNOSIS — Z30017 Encounter for initial prescription of implantable subdermal contraceptive: Secondary | ICD-10-CM

## 2021-01-03 MED ORDER — ETONOGESTREL 68 MG ~~LOC~~ IMPL
68.0000 mg | DRUG_IMPLANT | Freq: Once | SUBCUTANEOUS | Status: AC
  Administered 2021-01-03: 68 mg via SUBCUTANEOUS

## 2021-01-03 NOTE — Progress Notes (Signed)
[facility] DEPARTMENT [facility] [address] Main Number: [phone]    Family Planning Visit- Initial Visit  Subjective:  Brittany Vega is a 25 y.o.  [id number]   being seen today for an initial annual visit and to discuss contraceptive options.  The patient is currently using Condoms for pregnancy prevention. Patient reports she does not want a pregnancy in the next year.  Patient has the following medical conditions has Anxiety; Depression, recurrent (HCC); and Insomnia on their problem list.  Chief Complaint  Patient presents with   Contraception    Initial annual exam     Patient reports that she had her previous Nexplanon removed in April of this year and would like a new device inserted today.  Patient reports a history of anxiety and depression and takes Wellbutrin as prescribed from her PCP.  Patient denies any recent changes to personal or family history in the last year.  Per chart review, CBE due today and pap is also due today.  Patient denies any other concerns today.    Body mass index is 30.31 kg/m. - Patient is eligible for diabetes screening based on BMI and age >81?  not applicable HA1C ordered? not applicable  Patient reports 1  partner/s in last year. Desires STI screening?  No - patient declines.  Has patient been screened once for HCV in the past?  No  No results found for: HCVAB  Does the patient have current drug use (including MJ), have a partner with drug use, and/or has been incarcerated since last result? No  If yes-- Screen for HCV through [facility]   Does the patient meet criteria for HBV testing? No  Criteria:  -Household, sexual or needle sharing contact with HBV -History of drug use -HIV positive -Those with known Hep C   Health Maintenance Due  Topic Date Due   COVID-19 Vaccine (1) Never done   HPV VACCINES (2 - 3-dose series) 06/29/2014   PAP-Cervical Cytology Screening  Never done   PAP  SMEAR-Modifier  Never done   INFLUENZA VACCINE  11/06/2020    Review of Systems  All other systems reviewed and are negative.  The following portions of the patient's history were reviewed and updated as appropriate: allergies, current medications, past family history, past medical history, past social history, past surgical history and problem list. Problem list updated.   See flowsheet for other program required questions.  Objective:   Vitals:   01/01/21 1349  BP: 123/76  Weight: 187 lb 12.8 oz (85.2 kg)  Height: 5\' 6"  (1.676 m)    Physical Exam Vitals and nursing note reviewed.  Constitutional:      General: She is not in acute distress.    Appearance: Normal appearance.  HENT:     Head: Normocephalic and atraumatic.     Mouth/Throat:     Mouth: Mucous membranes are moist.     Pharynx: Oropharynx is clear. No oropharyngeal exudate or posterior oropharyngeal erythema.  Eyes:     Conjunctiva/sclera: Conjunctivae normal.  Cardiovascular:     Rate and Rhythm: Normal rate and regular rhythm.  Pulmonary:     Effort: Pulmonary effort is normal.     Breath sounds: Normal breath sounds.  Abdominal:     Palpations: Abdomen is soft. There is no mass.     Tenderness: There is no abdominal tenderness. There is no guarding or rebound.  Musculoskeletal:     Cervical back: Neck supple. No  tenderness.  Lymphadenopathy:     Cervical: No cervical adenopathy.  Skin:    General: Skin is warm and dry.     Findings: No bruising, erythema, lesion or rash.  Neurological:     Mental Status: She is alert and oriented to person, place, and time.  Psychiatric:        Mood and Affect: Mood normal.        Behavior: Behavior normal.        Thought Content: Thought content normal.        Judgment: Judgment normal.      Assessment and Plan:  Brittany Vega is a 25 y.o. female presenting to the [facility] Department for an initial annual wellness/contraceptive  visit  Contraception counseling: Reviewed all forms of birth control options in the tiered based approach. available including abstinence; over the counter/barrier methods; hormonal contraceptive medication including pill, patch, ring, injection,contraceptive implant, ECP; hormonal and nonhormonal IUDs; permanent sterilization options including vasectomy and the various tubal sterilization modalities. Risks, benefits, and typical effectiveness rates were reviewed.  Questions were answered.  Written information was also given to the patient to review.  Patient desires to have Nexplanon insertion today, this was prescribed for patient. She will follow up in  2-3 weeks for pelvic with pap, 1 year and prn for surveillance.  She was told to call with any further questions, or with any concerns about this method of contraception.  Emphasized use of condoms 100% of the time for STI prevention.  Patient was not a candidate for ECP today.  1. Encounter for counseling regarding contraception Reviewed with patient as above re: BCM options. Reviewed with patient normal SE of Nexplanon and when to call clinic with concerns. Enc condoms with all sex for STD protection.   2. Well woman exam (no gynecological exam) Reviewed with patient healthy habits to maintain general health. Patient declines pelvic/pap today due to heavy menses today. Enc patient to RTC in 2-3 weeks for pelvic with pap. Enc MVI 1 po daily. Enc to establish with/ follow up with PCP for primary care concerns, age appropriate screenings and illness.   3. Nexplanon insertion Nexplanon Insertion Procedure Patient identified, informed consent performed, consent signed.   Patient does understand that irregular bleeding is a very common side effect of this medication. She was advised to have backup contraception after placement. Patient was determined to meet WHO criteria for not being pregnant. Appropriate time out taken.  The insertion site was  identified 8-10 cm (3-4 inches) from the medial epicondyle of the humerus and 3-5 cm (1.25-2 inches) posterior to (below) the sulcus (groove) between the biceps and triceps muscles of the patient's left arm and marked.  Patient was prepped with alcohol swab and then injected with 3 ml of 1% lidocaine.  Arm was prepped with chlorhexidene, Nexplanon removed from packaging,  Device confirmed in needle, then inserted full length of needle and withdrawn per handbook instructions. Nexplanon was able to palpated in the patient's arm; patient palpated the insert herself. There was minimal blood loss.  Patient insertion site covered with guaze and a pressure bandage to reduce any bruising.  The patient tolerated the procedure well and was given post procedure instructions.    Counseled patient to take OTC analgesic starting as soon as lidocaine starts to wear off and take regularly for at least 48 hr to decrease discomfort.  Specifically to take with food or milk to decrease stomach upset and for IB 600 mg (3  tablets) every 6 hrs; IB 800 mg (4 tablets) every 8 hrs; or Aleve 2 tablets every 12 hrs.   - etonogestrel (NEXPLANON) implant 68 mg     Return in about 1 week (around 01/08/2021) for pelvic with pap.  No future appointments.  Matt Holmes, PA

## 2021-01-22 ENCOUNTER — Encounter: Payer: Self-pay | Admitting: Nurse Practitioner

## 2021-01-22 ENCOUNTER — Other Ambulatory Visit: Payer: Self-pay

## 2021-01-22 ENCOUNTER — Ambulatory Visit (INDEPENDENT_AMBULATORY_CARE_PROVIDER_SITE_OTHER): Payer: 59 | Admitting: Nurse Practitioner

## 2021-01-22 VITALS — BP 105/62 | HR 80

## 2021-01-22 DIAGNOSIS — F419 Anxiety disorder, unspecified: Secondary | ICD-10-CM

## 2021-01-22 DIAGNOSIS — F339 Major depressive disorder, recurrent, unspecified: Secondary | ICD-10-CM | POA: Diagnosis not present

## 2021-01-22 DIAGNOSIS — G4700 Insomnia, unspecified: Secondary | ICD-10-CM | POA: Diagnosis not present

## 2021-01-22 MED ORDER — BUPROPION HCL ER (XL) 300 MG PO TB24: 300.0000 mg | ORAL_TABLET | Freq: Every day | ORAL | 1 refills | Status: DC

## 2021-01-22 MED ORDER — TRAZODONE HCL 50 MG PO TABS: 25.0000 mg | ORAL_TABLET | Freq: Every evening | ORAL | 3 refills | Status: DC | PRN

## 2021-01-22 MED ORDER — BUPROPION HCL ER (XL) 150 MG PO TB24: 150.0000 mg | ORAL_TABLET | Freq: Every day | ORAL | 1 refills | Status: DC

## 2021-01-22 NOTE — Assessment & Plan Note (Signed)
Chronic.  Controlled.  Continue with current medication regimen of Wellbutrin 450mg daily. Return to clinic in 6 months for reevaluation.  Call sooner if concerns arise.  

## 2021-01-22 NOTE — Assessment & Plan Note (Signed)
Will give Trazodone for patient to help with sleep.  Side effects and benefits of medication discussed with patient during visit.  Follow up in 6 months for reevaluation.

## 2021-01-22 NOTE — Progress Notes (Signed)
BP 105/62   Pulse 80   LMP 12/31/2020 (Exact Date)   SpO2 98%    Subjective:    Patient ID: Brittany Vega, female    DOB: [birth year], 25 y.o.   MRN: [id number]  HPI: Brittany Vega is a 25 y.o. female  Chief Complaint  Patient presents with   Medication Refill    Patient is here requesting refill on     Medication Problem    Patient states she her Buspar was increased and states she wasn't having any side effects before the increased dosing and she is now noticing some side effects. Patient states she takes the Buspar at bedtime and states she never had any side effect 2 years before and now she gets this intense stomach pain as if she is hungry and it makes her stomach hurt.    Hypertension    Patient states within the last 2 weeks, when her heart at rest she notices her blood pressure has been elevated with systolic and diastolic number is higher than they should be, not extremely high. Patient states she noticed the top number has been in the 160's. Patient states she as a family history of Hypertension.    DEPRESSION/ANXIETY Mood status: controlled Satisfied with current treatment?: yes Symptom severity: mild  Duration of current treatment : years Side effects: no Medication compliance: excellent compliance Psychotherapy/counseling: no in the past Previous psychiatric medications: effexor and seroquel and abilify Depressed mood: no Anxious mood: yes Anhedonia: no Significant weight loss or gain: no Insomnia: yes hard to stay asleep- would like to try trazodone Fatigue: no Feelings of worthlessness or guilt: no Impaired concentration/indecisiveness: no Suicidal ideations: no Hopelessness: yes Crying spells: yes Depression screen [facility] 2/9 01/22/2021 01/01/2021 06/28/2020 01/04/2020 06/16/2019  Decreased Interest 0 0 0 0 1  Down, Depressed, Hopeless 0 0 1 0 1  PHQ - 2 Score 0 0 1 0 2  Altered sleeping 1 - 2 3 2   Tired, decreased energy 1 - 0 1 1  Change in appetite 0 - 0  0 0  Feeling bad or failure about yourself  0 - 1 1 1   Trouble concentrating 0 - 0 0 1  Moving slowly or fidgety/restless 0 - 0 0 0  Suicidal thoughts 0 - 0 0 0  PHQ-9 Score 2 - 4 5 7   Difficult doing work/chores Not difficult at all - Not difficult at all Somewhat difficult Somewhat difficult  Some recent data might be hidden     Relevant past medical, surgical, family and social history reviewed and updated as indicated. Interim medical history since our last visit reviewed. Allergies and medications reviewed and updated.  Review of Systems  Psychiatric/Behavioral:  Negative for decreased concentration, sleep disturbance and suicidal ideas. The patient is nervous/anxious.    Per HPI unless specifically indicated above     Objective:    BP 105/62   Pulse 80   LMP 12/31/2020 (Exact Date)   SpO2 98%   Wt Readings from Last 3 Encounters:  01/01/21 187 lb 12.8 oz (85.2 kg)  06/28/20 195 lb (88.5 kg)  01/04/20 195 lb (88.5 kg)    Physical Exam Vitals and nursing note reviewed.  Constitutional:      General: She is not in acute distress.    Appearance: Normal appearance. She is normal weight. She is not ill-appearing, toxic-appearing or diaphoretic.  HENT:     Head: Normocephalic.     Right Ear: External ear normal.  Left Ear: External ear normal.     Nose: Nose normal.     Mouth/Throat:     Mouth: Mucous membranes are moist.     Pharynx: Oropharynx is clear.  Eyes:     General:        Right eye: No discharge.        Left eye: No discharge.     Extraocular Movements: Extraocular movements intact.     Conjunctiva/sclera: Conjunctivae normal.     Pupils: Pupils are equal, round, and reactive to light.  Cardiovascular:     Rate and Rhythm: Normal rate and regular rhythm.     Heart sounds: No murmur heard. Pulmonary:     Effort: Pulmonary effort is normal. No respiratory distress.     Breath sounds: Normal breath sounds. No wheezing or rales.  Musculoskeletal:      Cervical back: Normal range of motion and neck supple.  Skin:    General: Skin is warm and dry.     Capillary Refill: Capillary refill takes less than 2 seconds.  Neurological:     General: No focal deficit present.     Mental Status: She is alert and oriented to person, place, and time. Mental status is at baseline.  Psychiatric:        Mood and Affect: Mood normal.        Behavior: Behavior normal.        Thought Content: Thought content normal.        Judgment: Judgment normal.    Results for orders placed or performed in visit on 06/28/20  Comp Met (CMET)  Result Value Ref Range   Glucose 87 65 - 99 mg/dL   BUN 9 6 - 20 mg/dL   Creatinine, Ser 0.70 0.57 - 1.00 mg/dL   eGFR 125 >59 mL/min/1.73   BUN/Creatinine Ratio 13 9 - 23   Sodium 144 134 - 144 mmol/L   Potassium 4.4 3.5 - 5.2 mmol/L   Chloride 106 96 - 106 mmol/L   CO2 22 20 - 29 mmol/L   Calcium 9.7 8.7 - 10.2 mg/dL   Total Protein 7.2 6.0 - 8.5 g/dL   Albumin 4.9 3.9 - 5.0 g/dL   Globulin, Total 2.3 1.5 - 4.5 g/dL   Albumin/Globulin Ratio 2.1 1.2 - 2.2   Bilirubin Total 0.3 0.0 - 1.2 mg/dL   Alkaline Phosphatase 69 44 - 121 IU/L   AST 15 0 - 40 IU/L   ALT 11 0 - 32 IU/L      Assessment & Plan:   Problem List Items Addressed This Visit       Other   Anxiety    Chronic.  Controlled.  Continue with current medication regimen of Wellbutrin 450m daily. Return to clinic in 6 months for reevaluation.  Call sooner if concerns arise.       Relevant Medications   buPROPion (WELLBUTRIN XL) 300 MG 24 hr tablet   buPROPion (WELLBUTRIN XL) 150 MG 24 hr tablet   traZODone (DESYREL) 50 MG tablet   Depression, recurrent (HCC) - Primary    Chronic.  Controlled.  Continue with current medication regimen of Wellbutrin 452mdaily. Return to clinic in 6 months for reevaluation.  Call sooner if concerns arise.        Relevant Medications   buPROPion (WELLBUTRIN XL) 300 MG 24 hr tablet   buPROPion (WELLBUTRIN XL) 150 MG  24 hr tablet   traZODone (DESYREL) 50 MG tablet   Insomnia    Will  give Trazodone for patient to help with sleep.  Side effects and benefits of medication discussed with patient during visit.  Follow up in 6 months for reevaluation.         Follow up plan: Return in about 6 months (around 07/23/2021) for Physical and Fasting labs.   A total of 30 minutes were spent on this encounter today.  When total time is documented, this includes both the face-to-face and non-face-to-face time personally spent before, during and after the visit on the date of the encounter.

## 2021-01-22 NOTE — Assessment & Plan Note (Signed)
Chronic.  Controlled.  Continue with current medication regimen of Wellbutrin 450mg  daily. Return to clinic in 6 months for reevaluation.  Call sooner if concerns arise.

## 2021-02-13 ENCOUNTER — Other Ambulatory Visit: Payer: Self-pay | Admitting: Nurse Practitioner

## 2021-02-13 NOTE — Telephone Encounter (Signed)
Requested medication (s) are due for refill today: No  Requested medication (s) are on the active medication list: Yes  Last refill:  01/22/21  Future visit scheduled: Yes  Notes to clinic:  Pharmacy asking for 90 day supply.    Requested Prescriptions  Pending Prescriptions Disp Refills   traZODone (DESYREL) 50 MG tablet [Pharmacy Med Name: TRAZODONE 50 MG TABLET] 90 tablet 2    Sig: TAKE 0.5-1 TABLETS BY MOUTH AT BEDTIME AS NEEDED FOR SLEEP.     Psychiatry: Antidepressants - Serotonin Modulator Passed - 02/13/2021 10:31 AM      Passed - Completed PHQ-2 or PHQ-9 in the last 360 days      Passed - Valid encounter within last 6 months    Recent Outpatient Visits           3 weeks ago Depression, recurrent (HCC)   [facility] Larae Grooms, NP   7 months ago Depression, recurrent (HCC)   [facility] Larae Grooms, NP   1 year ago Anxiety   [facility] Valentino Nose, NP   1 year ago Depression, recurrent [facility])   [facility] Particia Nearing, [place]   1 year ago Depression, recurrent [facility])   [facility] Particia Nearing, [place]       Future Appointments             In 5 months Larae Grooms, NP [facility]

## 2021-06-17 ENCOUNTER — Other Ambulatory Visit: Payer: Self-pay | Admitting: Nurse Practitioner

## 2021-06-18 NOTE — Telephone Encounter (Signed)
[facility] has refill left. ?Requested Prescriptions  ?Refused Prescriptions Disp Refills  ?? traZODone (DESYREL) 50 MG tablet [Pharmacy Med Name: TRAZODONE 50 MG TABLET] 90 tablet 1  ?  Sig: TAKE 1/2 TO 1 TABLET BY MOUTH AT BEDTIME AS NEEDED FOR SLEEP  ?  ? Psychiatry: Antidepressants - Serotonin Modulator Passed - 06/17/2021 10:22 AM  ?  ?  Passed - Completed PHQ-2 or PHQ-9 in the last 360 days  ?  ?  Passed - Valid encounter within last 6 months  ?  Recent Outpatient Visits   ?      ? 4 months ago Depression, recurrent (HCC)  ? [facility] Larae Grooms, NP  ? 11 months ago Depression, recurrent (HCC)  ? [facility] Larae Grooms, NP  ? 1 year ago Anxiety  ? [facility] Cathlean Marseilles A, NP  ? 1 year ago Depression, recurrent (HCC)  ? [facility] [place], [place], [place]  ? 2 years ago Depression, recurrent [facility])  ? [facility] [place], [place], [place]  ?  ?  ?Future Appointments   ?        ? In 1 month Larae Grooms, NP [facility]  ?  ? ?  ?  ?  ? ?

## 2021-06-21 NOTE — Progress Notes (Signed)
? ?BP 109/70   Pulse 75   Temp 98 ?F (36.7 ?C) (Oral)   Wt 185 lb (83.9 kg)   LMP  (LMP Unknown)   SpO2 99%   BMI 29.86 kg/m?   ? ?Subjective:  ? ? Patient ID: Brittany Vega, female    DOB: Jun 20, 1995, 26 y.o.   MRN: [id number] ? ?HPI: ?Brittany Vega is a 26 y.o. female ? ?Chief Complaint  ?Patient presents with  ? Depression  ? Anxiety  ?  Pt states she stopped taking her Buspar as she states it was not working for her   ? ?DEPRESSION/ANXIETY ?Patient states her anxiety has been worse the last few weeks.  States she left her job in December.  States the Buspar was making her extremely hungry so she stopped taking the medication.  The Wellbutrin helps a lot with her depression but not her anxiety. She gained a good amount of weight on the Abilify and Effexor which is why she stopped taking them.   ? ? ?[place] [facility] Visit from 06/22/2021 in [place]  ?PHQ-9 Total Score 5  ? ?  ? ?GAD 7 : Generalized Anxiety Score 06/22/2021 06/28/2020 01/04/2020 06/16/2019  ?Nervous, Anxious, on Edge 3 3 1 1   ?Control/stop worrying 2 2 1  0  ?Worry too much - different things 2 1 1 1   ?Trouble relaxing 2 1 0 1  ?Restless 0 0 0 0  ?Easily annoyed or irritable 1 0 0 1  ?Afraid - awful might happen 0 0 1 0  ?Total GAD 7 Score 10 7 4 4   ?Anxiety Difficulty Somewhat difficult Somewhat difficult Somewhat difficult Somewhat difficult  ? ? ? ?Relevant past medical, surgical, family and social history reviewed and updated as indicated. Interim medical history since our last visit reviewed. ?Allergies and medications reviewed and updated. ? ?Review of Systems  ?Psychiatric/Behavioral:  Positive for dysphoric mood. Negative for suicidal ideas. The patient is nervous/anxious.   ? ?Per HPI unless specifically indicated above ? ?   ?Objective:  ?  ?BP 109/70   Pulse 75   Temp 98 ?F (36.7 ?C) (Oral)   Wt 185 lb (83.9 kg)   LMP  (LMP Unknown)   SpO2 99%   BMI 29.86 kg/m?   ?Wt Readings from Last 3 Encounters:   ?06/22/21 185 lb (83.9 kg)  ?01/01/21 187 lb 12.8 oz (85.2 kg)  ?06/28/20 195 lb (88.5 kg)  ?  ?Physical Exam ?Vitals and nursing note reviewed.  ?Constitutional:   ?   General: She is not in acute distress. ?   Appearance: Normal appearance. She is normal weight. She is not ill-appearing, toxic-appearing or diaphoretic.  ?HENT:  ?   Head: Normocephalic.  ?   Right Ear: External ear normal.  ?   Left Ear: External ear normal.  ?   Nose: Nose normal.  ?   Mouth/Throat:  ?   Mouth: Mucous membranes are moist.  ?   Pharynx: Oropharynx is clear.  ?Eyes:  ?   General:     ?   Right eye: No discharge.     ?   Left eye: No discharge.  ?   Extraocular Movements: Extraocular movements intact.  ?   Conjunctiva/sclera: Conjunctivae normal.  ?   Pupils: Pupils are equal, round, and reactive to light.  ?Cardiovascular:  ?   Rate and Rhythm: Normal rate and regular rhythm.  ?   Heart sounds: No murmur heard. ?Pulmonary:  ?  Effort: Pulmonary effort is normal. No respiratory distress.  ?   Breath sounds: Normal breath sounds. No wheezing or rales.  ?Musculoskeletal:  ?   Cervical back: Normal range of motion and neck supple.  ?Skin: ?   General: Skin is warm and dry.  ?   Capillary Refill: Capillary refill takes less than 2 seconds.  ?Neurological:  ?   General: No focal deficit present.  ?   Mental Status: She is alert and oriented to person, place, and time. Mental status is at baseline.  ?Psychiatric:     ?   Mood and Affect: Mood normal.     ?   Behavior: Behavior normal.     ?   Thought Content: Thought content normal.     ?   Judgment: Judgment normal.  ? ? ?Results for orders placed or performed in visit on 06/28/20  ?Comp Met (CMET)  ?Result Value Ref Range  ? Glucose 87 65 - 99 mg/dL  ? BUN 9 6 - 20 mg/dL  ? Creatinine, Ser 0.70 0.57 - 1.00 mg/dL  ? eGFR 125 >59 mL/min/1.73  ? BUN/Creatinine Ratio 13 9 - 23  ? Sodium 144 134 - 144 mmol/L  ? Potassium 4.4 3.5 - 5.2 mmol/L  ? Chloride 106 96 - 106 mmol/L  ? CO2 22 20 - 29  mmol/L  ? Calcium 9.7 8.7 - 10.2 mg/dL  ? Total Protein 7.2 6.0 - 8.5 g/dL  ? Albumin 4.9 3.9 - 5.0 g/dL  ? Globulin, Total 2.3 1.5 - 4.5 g/dL  ? Albumin/Globulin Ratio 2.1 1.2 - 2.2  ? Bilirubin Total 0.3 0.0 - 1.2 mg/dL  ? Alkaline Phosphatase 69 44 - 121 IU/L  ? AST 15 0 - 40 IU/L  ? ALT 11 0 - 32 IU/L  ? ?   ?Assessment & Plan:  ? ?Problem List Items Addressed This Visit   ? ?  ? Other  ? Anxiety - Primary  ?  Chronic. Not well controlled. Will continue with the Wellbutrin 41m daily. Will start Rexulti 0.227mdaily.  Side effects and benefits discussed during visit.  Patient did well with Abilify but it caused her to gain weight.  Patient also gained weight with Effexor.  Will increase Trazodone to 10056mightly to help with sleep.  Follow up in 1 month for reevaluation. ?  ?  ? Relevant Medications  ? traZODone (DESYREL) 100 MG tablet  ? Other Relevant Orders  ? Ambulatory referral to Psychology  ? Depression, recurrent (HCCIrrigon?  Chronic. Not well controlled. Will continue with the Wellbutrin 450m81mily. Will start Rexulti 0.25mg21mly.  Side effects and benefits discussed during visit.  Patient did well with Abilify but it caused her to gain weight.  Patient also gained weight with Effexor.  Will increase Trazodone to 100mg 34mtly to help with sleep.  Follow up in 1 month for reevaluation. ?  ?  ? Relevant Medications  ? traZODone (DESYREL) 100 MG tablet  ? Other Relevant Orders  ? Ambulatory referral to Psychology  ? Insomnia  ?  Will increase Trazodone dose to 100mg n81mly to help with sleep.  Follow up in 1 month for reevaluation. ?  ?  ?  ? ?Follow up plan: ?Return in about 1 month (around 07/23/2021) for Depression/Anxiety FU. ? ? ? ? ? ?

## 2021-06-22 ENCOUNTER — Encounter: Payer: Self-pay | Admitting: Nurse Practitioner

## 2021-06-22 ENCOUNTER — Ambulatory Visit (INDEPENDENT_AMBULATORY_CARE_PROVIDER_SITE_OTHER): Payer: 59 | Admitting: Nurse Practitioner

## 2021-06-22 ENCOUNTER — Other Ambulatory Visit: Payer: Self-pay

## 2021-06-22 VITALS — BP 109/70 | HR 75 | Temp 98.0°F | Wt 185.0 lb

## 2021-06-22 DIAGNOSIS — R69 Illness, unspecified: Secondary | ICD-10-CM | POA: Diagnosis not present

## 2021-06-22 DIAGNOSIS — F419 Anxiety disorder, unspecified: Secondary | ICD-10-CM

## 2021-06-22 DIAGNOSIS — G4700 Insomnia, unspecified: Secondary | ICD-10-CM | POA: Diagnosis not present

## 2021-06-22 DIAGNOSIS — F339 Major depressive disorder, recurrent, unspecified: Secondary | ICD-10-CM | POA: Diagnosis not present

## 2021-06-22 MED ORDER — TRAZODONE HCL 100 MG PO TABS: ORAL_TABLET | ORAL | 1 refills | Status: DC

## 2021-06-22 MED ORDER — BREXPIPRAZOLE 0.25 MG PO TABS: 0.2500 mg | ORAL_TABLET | Freq: Every day | ORAL | 0 refills | Status: DC

## 2021-06-22 NOTE — Assessment & Plan Note (Signed)
Will increase Trazodone dose to 100mg  nightly to help with sleep.  Follow up in 1 month for reevaluation. ?

## 2021-06-22 NOTE — Assessment & Plan Note (Signed)
Chronic. Not well controlled. Will continue with the Wellbutrin 450mg  daily. Will start Rexulti 0.25mg  daily.  Side effects and benefits discussed during visit.  Patient did well with Abilify but it caused her to gain weight.  Patient also gained weight with Effexor.  Will increase Trazodone to 100mg  nightly to help with sleep.  Follow up in 1 month for reevaluation. ?

## 2021-06-22 NOTE — Assessment & Plan Note (Signed)
Chronic. Not well controlled. Will continue with the Wellbutrin 450mg daily. Will start Rexulti 0.25mg daily.  Side effects and benefits discussed during visit.  Patient did well with Abilify but it caused her to gain weight.  Patient also gained weight with Effexor.  Will increase Trazodone to 100mg nightly to help with sleep.  Follow up in 1 month for reevaluation. ?

## 2021-07-11 ENCOUNTER — Encounter: Payer: Self-pay | Admitting: Nurse Practitioner

## 2021-07-12 DIAGNOSIS — M9905 Segmental and somatic dysfunction of pelvic region: Secondary | ICD-10-CM | POA: Diagnosis not present

## 2021-07-12 DIAGNOSIS — M9903 Segmental and somatic dysfunction of lumbar region: Secondary | ICD-10-CM | POA: Diagnosis not present

## 2021-07-12 DIAGNOSIS — M955 Acquired deformity of pelvis: Secondary | ICD-10-CM | POA: Diagnosis not present

## 2021-07-12 DIAGNOSIS — M545 Low back pain, unspecified: Secondary | ICD-10-CM | POA: Diagnosis not present

## 2021-07-17 ENCOUNTER — Telehealth: Payer: Self-pay

## 2021-07-17 DIAGNOSIS — M955 Acquired deformity of pelvis: Secondary | ICD-10-CM | POA: Diagnosis not present

## 2021-07-17 DIAGNOSIS — M545 Low back pain, unspecified: Secondary | ICD-10-CM | POA: Diagnosis not present

## 2021-07-17 DIAGNOSIS — M9903 Segmental and somatic dysfunction of lumbar region: Secondary | ICD-10-CM | POA: Diagnosis not present

## 2021-07-17 DIAGNOSIS — M9905 Segmental and somatic dysfunction of pelvic region: Secondary | ICD-10-CM | POA: Diagnosis not present

## 2021-07-17 NOTE — Telephone Encounter (Signed)
PA for Rexulti initiated and submitted via Cover My Meds. Key: [id number] ?

## 2021-07-18 NOTE — Telephone Encounter (Signed)
LVM for patient regarding approval of Rexulti, advised pt to have pharmacy re-run the script as It should go through with no problems.  ?

## 2021-07-20 DIAGNOSIS — M9905 Segmental and somatic dysfunction of pelvic region: Secondary | ICD-10-CM | POA: Diagnosis not present

## 2021-07-20 DIAGNOSIS — M545 Low back pain, unspecified: Secondary | ICD-10-CM | POA: Diagnosis not present

## 2021-07-20 DIAGNOSIS — M955 Acquired deformity of pelvis: Secondary | ICD-10-CM | POA: Diagnosis not present

## 2021-07-20 DIAGNOSIS — M9903 Segmental and somatic dysfunction of lumbar region: Secondary | ICD-10-CM | POA: Diagnosis not present

## 2021-07-23 ENCOUNTER — Ambulatory Visit: Payer: 59 | Admitting: Nurse Practitioner

## 2021-07-23 ENCOUNTER — Encounter: Payer: 59 | Admitting: Nurse Practitioner

## 2021-07-23 NOTE — Progress Notes (Deleted)
? ?There were no vitals taken for this visit.  ? ?Subjective:  ? ? Patient ID: Brittany Vega, female    DOB: Oct 30, 1995, 26 y.o.   MRN: [id number] ? ?HPI: ?Brittany Vega is a 26 y.o. female presenting on 07/23/2021 for comprehensive medical examination. Current medical complaints include:{Blank single:19197::"none","***"} ? ?She currently lives with: ?Menopausal Symptoms: {Blank single:19197::"yes","no"} ? ?ANXIETY/DEPRESSION ? ? ?Depression Screen done today and results listed below:  ? ?  06/22/2021  ?  9:41 AM 01/22/2021  ?  3:20 PM 01/01/2021  ?  1:56 PM 06/28/2020  ? 10:13 AM 01/04/2020  ?  1:20 PM  ?Depression screen PHQ 2/9  ?Decreased Interest 1 0 0 0 0  ?Down, Depressed, Hopeless 1 0 0 1 0  ?PHQ - 2 Score 2 0 0 1 0  ?Altered sleeping 1 1  2 3   ?Tired, decreased energy 1 1  0 1  ?Change in appetite 0 0  0 0  ?Feeling bad or failure about yourself  0 0  1 1  ?Trouble concentrating 1 0  0 0  ?Moving slowly or fidgety/restless 0 0  0 0  ?Suicidal thoughts 0 0  0 0  ?PHQ-9 Score 5 2  4 5   ?Difficult doing work/chores Somewhat difficult Not difficult at all  Not difficult at all Somewhat difficult  ? ? ?The patient {has/does not have:19849} a history of falls. I {did/did not:19850} complete a risk assessment for falls. A plan of care for falls {was/was not:19852} documented. ? ? ?Past Medical History:  ?Past Medical History:  ?Diagnosis Date  ? Depression   ? ? ?Surgical History:  ?No past surgical history on file. ? ?Medications:  ?Current Outpatient Medications on File Prior to Visit  ?Medication Sig  ? brexpiprazole (REXULTI) 0.25 MG TABS tablet Take 1 tablet (0.25 mg total) by mouth daily.  ? buPROPion (WELLBUTRIN XL) 150 MG 24 hr tablet Take 1 tablet (150 mg total) by mouth daily.  ? buPROPion (WELLBUTRIN XL) 300 MG 24 hr tablet Take 1 tablet (300 mg total) by mouth daily.  ? etonogestrel (NEXPLANON) 68 MG IMPL implant 1 each by Subdermal route once. Placed April 2019  ? ketoconazole (NIZORAL) 2 % shampoo Apply  topically 3 (three) times a week.  ? traZODone (DESYREL) 100 MG tablet TAKE 0.5-1 TABLETS BY MOUTH AT BEDTIME AS NEEDED FOR SLEEP.  ? ?No current facility-administered medications on file prior to visit.  ? ? ?Allergies:  ?Allergies  ?Allergen Reactions  ? Seroquel [Quetiapine Fumarate] Other (See Comments)  ?  Restless legs  ? ? ?Social History:  ?Social History  ? ?Socioeconomic History  ? Marital status: Single  ?  Spouse name: Not on file  ? Number of children: Not on file  ? Years of education: Not on file  ? Highest education level: Not on file  ?Occupational History  ? Not on file  ?Tobacco Use  ? Smoking status: Never  ? Smokeless tobacco: Never  ?Vaping Use  ? Vaping Use: Never used  ?Substance and Sexual Activity  ? Alcohol use: No  ? Drug use: No  ? Sexual activity: Yes  ?  Birth control/protection: Condom  ?Other Topics Concern  ? Not on file  ?Social History Narrative  ? Not on file  ? ?Social Determinants of Health  ? ?Financial Resource Strain: Not on file  ?Food Insecurity: Not on file  ?Transportation Needs: Not on file  ?Physical Activity: Not on file  ?Stress: Not on file  ?  Social Connections: Not on file  ?Intimate Partner Violence: Not At Risk  ? Fear of Current or Ex-Partner: No  ? Emotionally Abused: No  ? Physically Abused: No  ? Sexually Abused: No  ? ?Social History  ? ?Tobacco Use  ?Smoking Status Never  ?Smokeless Tobacco Never  ? ?Social History  ? ?Substance and Sexual Activity  ?Alcohol Use No  ? ? ?Family History:  ?Family History  ?Problem Relation Age of Onset  ? Hypertension Mother   ? Congestive Heart Failure Maternal Grandmother   ? COPD Maternal Grandmother   ? Fibromyalgia Maternal Grandmother   ? Diabetes Maternal Grandfather   ? ? ?Past medical history, surgical history, medications, allergies, family history and social history reviewed with patient today and changes made to appropriate areas of the chart.  ? ?ROS ?All other ROS negative except what is listed above and in the  HPI.  ? ?   ?Objective:  ?  ?There were no vitals taken for this visit.  ?Wt Readings from Last 3 Encounters:  ?06/22/21 185 lb (83.9 kg)  ?01/01/21 187 lb 12.8 oz (85.2 kg)  ?06/28/20 195 lb (88.5 kg)  ?  ?Physical Exam ? ?Results for orders placed or performed in visit on 06/28/20  ?Comp Met (CMET)  ?Result Value Ref Range  ? Glucose 87 65 - 99 mg/dL  ? BUN 9 6 - 20 mg/dL  ? Creatinine, Ser 0.70 0.57 - 1.00 mg/dL  ? eGFR 125 >59 mL/min/1.73  ? BUN/Creatinine Ratio 13 9 - 23  ? Sodium 144 134 - 144 mmol/L  ? Potassium 4.4 3.5 - 5.2 mmol/L  ? Chloride 106 96 - 106 mmol/L  ? CO2 22 20 - 29 mmol/L  ? Calcium 9.7 8.7 - 10.2 mg/dL  ? Total Protein 7.2 6.0 - 8.5 g/dL  ? Albumin 4.9 3.9 - 5.0 g/dL  ? Globulin, Total 2.3 1.5 - 4.5 g/dL  ? Albumin/Globulin Ratio 2.1 1.2 - 2.2  ? Bilirubin Total 0.3 0.0 - 1.2 mg/dL  ? Alkaline Phosphatase 69 44 - 121 IU/L  ? AST 15 0 - 40 IU/L  ? ALT 11 0 - 32 IU/L  ? ?   ?Assessment & Plan:  ? ?Problem List Items Addressed This Visit   ? ?  ? Other  ? Anxiety  ? Depression, recurrent ([place])  ? ?Other Visit Diagnoses   ? ? Annual physical exam    -  Primary  ? ?  ?  ? ?Follow up plan: ?No follow-ups on file. ? ? ?LABORATORY TESTING:  ?- Pap smear: {Blank GYFVCB:44967::"RFF done","not applicable","up to date","done elsewhere"} ? ?IMMUNIZATIONS:   ?- Tdap: Tetanus vaccination status reviewed: {tetanus status:315746}. ?- Influenza: {Blank single:19197::"Up to date","Administered today","Postponed to flu season","Refused","Given elsewhere"} ?- Pneumovax: {Blank single:19197::"Up to date","Administered today","Not applicable","Refused","Given elsewhere"} ?- Prevnar: {Blank single:19197::"Up to date","Administered today","Not applicable","Refused","Given elsewhere"} ?- COVID: {Blank single:19197::"Up to date","Administered today","Not applicable","Refused","Given elsewhere"} ?- HPV: {Blank single:19197::"Up to date","Administered today","Not applicable","Refused","Given elsewhere"} ?- Shingrix  vaccine: {Blank single:19197::"Up to date","Administered today","Not applicable","Refused","Given elsewhere"} ? ?SCREENING: ?-Mammogram: {Blank single:19197::"Up to date","Ordered today","Not applicable","Refused","Done elsewhere"}  ?- Colonoscopy: {Blank single:19197::"Up to date","Ordered today","Not applicable","Refused","Done elsewhere"}  ?- Bone Density: {Blank single:19197::"Up to date","Ordered today","Not applicable","Refused","Done elsewhere"}  ?-Hearing Test: {Blank single:19197::"Up to date","Ordered today","Not applicable","Refused","Done elsewhere"}  ?-Spirometry: {Blank single:19197::"Up to date","Ordered today","Not applicable","Refused","Done elsewhere"}  ? ?PATIENT COUNSELING:   ?Advised to take 1 mg of folate supplement per day if capable of pregnancy.  ? ?Sexuality: Discussed sexually transmitted diseases, partner selection, use of  condoms, avoidance of unintended pregnancy  and contraceptive alternatives.  ? ?Advised to avoid cigarette smoking. ? ?I discussed with the patient that most people either abstain from alcohol or drink within safe limits (<=14/week and <=4 drinks/occasion for males, <=7/weeks and <= 3 drinks/occasion for females) and that the risk for alcohol disorders and other health effects rises proportionally with the number of drinks per week and how often a drinker exceeds daily limits. ? ?Discussed cessation/primary prevention of drug use and availability of treatment for abuse.  ? ?Diet: Encouraged to adjust caloric intake to maintain  or achieve ideal body weight, to reduce intake of dietary saturated fat and total fat, to limit sodium intake by avoiding high sodium foods and not adding table salt, and to maintain adequate dietary potassium and calcium preferably from fresh fruits, vegetables, and low-fat dairy products.   ? ?stressed the importance of regular exercise ? ?Injury prevention: Discussed safety belts, safety helmets, smoke detector, smoking near bedding or  upholstery.  ? ?Dental health: Discussed importance of regular tooth brushing, flossing, and dental visits.  ? ? ?NEXT PREVENTATIVE PHYSICAL DUE IN 1 YEAR. ?No follow-ups on file. ? ? ? ? ? ? ? ? ? ?

## 2021-07-23 NOTE — Progress Notes (Deleted)
? ?There were no vitals taken for this visit.  ? ?Subjective:  ? ? Patient ID: Brittany Vega, female    DOB: February 11, 1996, 26 y.o.   MRN: [id number] ? ?HPI: ?Brittany Vega is a 26 y.o. female presenting on 07/23/2021 for comprehensive medical examination. Current medical complaints include:{Blank single:19197::"none","***"} ? ?She currently lives with: ?Menopausal Symptoms: {Blank single:19197::"yes","no"} ? ?ANXIETY/DEPRESSION ? ? ?Depression Screen done today and results listed below:  ? ?  06/22/2021  ?  9:41 AM 01/22/2021  ?  3:20 PM 01/01/2021  ?  1:56 PM 06/28/2020  ? 10:13 AM 01/04/2020  ?  1:20 PM  ?Depression screen PHQ 2/9  ?Decreased Interest 1 0 0 0 0  ?Down, Depressed, Hopeless 1 0 0 1 0  ?PHQ - 2 Score 2 0 0 1 0  ?Altered sleeping 1 1  2 3   ?Tired, decreased energy 1 1  0 1  ?Change in appetite 0 0  0 0  ?Feeling bad or failure about yourself  0 0  1 1  ?Trouble concentrating 1 0  0 0  ?Moving slowly or fidgety/restless 0 0  0 0  ?Suicidal thoughts 0 0  0 0  ?PHQ-9 Score 5 2  4 5   ?Difficult doing work/chores Somewhat difficult Not difficult at all  Not difficult at all Somewhat difficult  ? ? ?The patient {has/does not have:19849} a history of falls. I {did/did not:19850} complete a risk assessment for falls. A plan of care for falls {was/was not:19852} documented. ? ? ?Past Medical History:  ?Past Medical History:  ?Diagnosis Date  ? Depression   ? ? ?Surgical History:  ?No past surgical history on file. ? ?Medications:  ?Current Outpatient Medications on File Prior to Visit  ?Medication Sig  ? brexpiprazole (REXULTI) 0.25 MG TABS tablet Take 1 tablet (0.25 mg total) by mouth daily.  ? buPROPion (WELLBUTRIN XL) 150 MG 24 hr tablet Take 1 tablet (150 mg total) by mouth daily.  ? buPROPion (WELLBUTRIN XL) 300 MG 24 hr tablet Take 1 tablet (300 mg total) by mouth daily.  ? etonogestrel (NEXPLANON) 68 MG IMPL implant 1 each by Subdermal route once. Placed April 2019  ? ketoconazole (NIZORAL) 2 % shampoo Apply  topically 3 (three) times a week.  ? traZODone (DESYREL) 100 MG tablet TAKE 0.5-1 TABLETS BY MOUTH AT BEDTIME AS NEEDED FOR SLEEP.  ? ?No current facility-administered medications on file prior to visit.  ? ? ?Allergies:  ?Allergies  ?Allergen Reactions  ? Seroquel [Quetiapine Fumarate] Other (See Comments)  ?  Restless legs  ? ? ?Social History:  ?Social History  ? ?Socioeconomic History  ? Marital status: Single  ?  Spouse name: Not on file  ? Number of children: Not on file  ? Years of education: Not on file  ? Highest education level: Not on file  ?Occupational History  ? Not on file  ?Tobacco Use  ? Smoking status: Never  ? Smokeless tobacco: Never  ?Vaping Use  ? Vaping Use: Never used  ?Substance and Sexual Activity  ? Alcohol use: No  ? Drug use: No  ? Sexual activity: Yes  ?  Birth control/protection: Condom  ?Other Topics Concern  ? Not on file  ?Social History Narrative  ? Not on file  ? ?Social Determinants of Health  ? ?Financial Resource Strain: Not on file  ?Food Insecurity: Not on file  ?Transportation Needs: Not on file  ?Physical Activity: Not on file  ?Stress: Not on file  ?  Social Connections: Not on file  ?Intimate Partner Violence: Not At Risk  ? Fear of Current or Ex-Partner: No  ? Emotionally Abused: No  ? Physically Abused: No  ? Sexually Abused: No  ? ?Social History  ? ?Tobacco Use  ?Smoking Status Never  ?Smokeless Tobacco Never  ? ?Social History  ? ?Substance and Sexual Activity  ?Alcohol Use No  ? ? ?Family History:  ?Family History  ?Problem Relation Age of Onset  ? Hypertension Mother   ? Congestive Heart Failure Maternal Grandmother   ? COPD Maternal Grandmother   ? Fibromyalgia Maternal Grandmother   ? Diabetes Maternal Grandfather   ? ? ?Past medical history, surgical history, medications, allergies, family history and social history reviewed with patient today and changes made to appropriate areas of the chart.  ? ?ROS ?All other ROS negative except what is listed above and in the  HPI.  ? ?   ?Objective:  ?  ?There were no vitals taken for this visit.  ?Wt Readings from Last 3 Encounters:  ?06/22/21 185 lb (83.9 kg)  ?01/01/21 187 lb 12.8 oz (85.2 kg)  ?06/28/20 195 lb (88.5 kg)  ?  ?Physical Exam ? ?Results for orders placed or performed in visit on 06/28/20  ?Comp Met (CMET)  ?Result Value Ref Range  ? Glucose 87 65 - 99 mg/dL  ? BUN 9 6 - 20 mg/dL  ? Creatinine, Ser 0.70 0.57 - 1.00 mg/dL  ? eGFR 125 >59 mL/min/1.73  ? BUN/Creatinine Ratio 13 9 - 23  ? Sodium 144 134 - 144 mmol/L  ? Potassium 4.4 3.5 - 5.2 mmol/L  ? Chloride 106 96 - 106 mmol/L  ? CO2 22 20 - 29 mmol/L  ? Calcium 9.7 8.7 - 10.2 mg/dL  ? Total Protein 7.2 6.0 - 8.5 g/dL  ? Albumin 4.9 3.9 - 5.0 g/dL  ? Globulin, Total 2.3 1.5 - 4.5 g/dL  ? Albumin/Globulin Ratio 2.1 1.2 - 2.2  ? Bilirubin Total 0.3 0.0 - 1.2 mg/dL  ? Alkaline Phosphatase 69 44 - 121 IU/L  ? AST 15 0 - 40 IU/L  ? ALT 11 0 - 32 IU/L  ? ?   ?Assessment & Plan:  ? ?Problem List Items Addressed This Visit   ? ?  ? Other  ? Anxiety  ? Depression, recurrent ([place]) - Primary  ?  ? ?Follow up plan: ?No follow-ups on file. ? ? ?LABORATORY TESTING:  ?- Pap smear: {Blank WGNFAO:13086::"VHQ done","not applicable","up to date","done elsewhere"} ? ?IMMUNIZATIONS:   ?- Tdap: Tetanus vaccination status reviewed: {tetanus status:315746}. ?- Influenza: {Blank single:19197::"Up to date","Administered today","Postponed to flu season","Refused","Given elsewhere"} ?- Pneumovax: {Blank single:19197::"Up to date","Administered today","Not applicable","Refused","Given elsewhere"} ?- Prevnar: {Blank single:19197::"Up to date","Administered today","Not applicable","Refused","Given elsewhere"} ?- COVID: {Blank single:19197::"Up to date","Administered today","Not applicable","Refused","Given elsewhere"} ?- HPV: {Blank single:19197::"Up to date","Administered today","Not applicable","Refused","Given elsewhere"} ?- Shingrix vaccine: {Blank single:19197::"Up to date","Administered  today","Not applicable","Refused","Given elsewhere"} ? ?SCREENING: ?-Mammogram: {Blank single:19197::"Up to date","Ordered today","Not applicable","Refused","Done elsewhere"}  ?- Colonoscopy: {Blank single:19197::"Up to date","Ordered today","Not applicable","Refused","Done elsewhere"}  ?- Bone Density: {Blank single:19197::"Up to date","Ordered today","Not applicable","Refused","Done elsewhere"}  ?-Hearing Test: {Blank single:19197::"Up to date","Ordered today","Not applicable","Refused","Done elsewhere"}  ?-Spirometry: {Blank single:19197::"Up to date","Ordered today","Not applicable","Refused","Done elsewhere"}  ? ?PATIENT COUNSELING:   ?Advised to take 1 mg of folate supplement per day if capable of pregnancy.  ? ?Sexuality: Discussed sexually transmitted diseases, partner selection, use of condoms, avoidance of unintended pregnancy  and contraceptive alternatives.  ? ?Advised to avoid cigarette smoking. ? ?I discussed  with the patient that most people either abstain from alcohol or drink within safe limits (<=14/week and <=4 drinks/occasion for males, <=7/weeks and <= 3 drinks/occasion for females) and that the risk for alcohol disorders and other health effects rises proportionally with the number of drinks per week and how often a drinker exceeds daily limits. ? ?Discussed cessation/primary prevention of drug use and availability of treatment for abuse.  ? ?Diet: Encouraged to adjust caloric intake to maintain  or achieve ideal body weight, to reduce intake of dietary saturated fat and total fat, to limit sodium intake by avoiding high sodium foods and not adding table salt, and to maintain adequate dietary potassium and calcium preferably from fresh fruits, vegetables, and low-fat dairy products.   ? ?stressed the importance of regular exercise ? ?Injury prevention: Discussed safety belts, safety helmets, smoke detector, smoking near bedding or upholstery.  ? ?Dental health: Discussed importance of regular  tooth brushing, flossing, and dental visits.  ? ? ?NEXT PREVENTATIVE PHYSICAL DUE IN 1 YEAR. ?No follow-ups on file. ? ? ? ? ? ? ? ? ? ?

## 2021-07-25 DIAGNOSIS — M9903 Segmental and somatic dysfunction of lumbar region: Secondary | ICD-10-CM | POA: Diagnosis not present

## 2021-07-25 DIAGNOSIS — M955 Acquired deformity of pelvis: Secondary | ICD-10-CM | POA: Diagnosis not present

## 2021-07-25 DIAGNOSIS — M545 Low back pain, unspecified: Secondary | ICD-10-CM | POA: Diagnosis not present

## 2021-07-25 DIAGNOSIS — M9905 Segmental and somatic dysfunction of pelvic region: Secondary | ICD-10-CM | POA: Diagnosis not present

## 2021-08-07 ENCOUNTER — Telehealth: Payer: 59 | Admitting: Nurse Practitioner

## 2021-11-06 ENCOUNTER — Encounter: Payer: Self-pay | Admitting: Unknown Physician Specialty

## 2021-11-06 ENCOUNTER — Ambulatory Visit (INDEPENDENT_AMBULATORY_CARE_PROVIDER_SITE_OTHER): Payer: 59 | Admitting: Unknown Physician Specialty

## 2021-11-06 VITALS — BP 118/69 | HR 89 | Temp 98.9°F | Wt 182.1 lb

## 2021-11-06 DIAGNOSIS — F339 Major depressive disorder, recurrent, unspecified: Secondary | ICD-10-CM | POA: Diagnosis not present

## 2021-11-06 DIAGNOSIS — R051 Acute cough: Secondary | ICD-10-CM

## 2021-11-06 DIAGNOSIS — R69 Illness, unspecified: Secondary | ICD-10-CM | POA: Diagnosis not present

## 2021-11-06 MED ORDER — TRAZODONE HCL 100 MG PO TABS: ORAL_TABLET | ORAL | 1 refills | Status: DC

## 2021-11-06 MED ORDER — BUPROPION HCL ER (XL) 300 MG PO TB24: 300.0000 mg | ORAL_TABLET | Freq: Every day | ORAL | 1 refills | Status: DC

## 2021-11-06 MED ORDER — ALBUTEROL SULFATE HFA 108 (90 BASE) MCG/ACT IN AERS: 2.0000 | INHALATION_SPRAY | Freq: Four times a day (QID) | RESPIRATORY_TRACT | 0 refills | Status: DC | PRN

## 2021-11-06 MED ORDER — BUPROPION HCL ER (XL) 150 MG PO TB24: 150.0000 mg | ORAL_TABLET | Freq: Every day | ORAL | 1 refills | Status: DC

## 2021-11-06 NOTE — Progress Notes (Signed)
BP 118/69   Pulse 89   Temp 98.9 F (37.2 C) (Oral)   Wt 182 lb 1.6 oz (82.6 kg)   SpO2 98%   BMI 29.39 kg/m    Subjective:    Patient ID: Brittany Vega, female    DOB: [birth year], 26 y.o.   MRN: [id number]  HPI: Brittany Vega is a 26 y.o. female  Chief Complaint  Patient presents with   Cough    Pt states she has had a cough for a week, states it has been a little bit productive. No other symptoms per patient.    Cough This is a new problem. Episode onset: 8 days ago felt a little funy and "off".   Cough for last 5 days and last 3 days mildly productive. The problem has been unchanged. Pertinent negatives include no chest pain, chills, ear congestion, ear pain, fever, headaches, myalgias, rhinorrhea, sore throat, shortness of breath or sweats. Exacerbated by: eating, drinking, ihaling deeply but happens randomly. She has tried nothing for the symptoms. There is no history of asthma.   Also wants to check her other medications.  Wellbutrin is the best depression medication she has tried.  Trazadone does help her sleep and sometimes needs melatonin    11/06/2021    1:50 PM 06/22/2021    9:41 AM 01/22/2021    3:20 PM 01/01/2021    1:56 PM 06/28/2020   10:13 AM  Depression screen PHQ 2/9  Decreased Interest 0 1 0 0 0  Down, Depressed, Hopeless 1 1 0 0 1  PHQ - 2 Score 1 2 0 0 1  Altered sleeping 1 1 1  2   Tired, decreased energy 1 1 1   0  Change in appetite 0 0 0  0  Feeling bad or failure about yourself  0 0 0  1  Trouble concentrating 1 1 0  0  Moving slowly or fidgety/restless 0 0 0  0  Suicidal thoughts 0 0 0  0  PHQ-9 Score 4 5 2  4   Difficult doing work/chores Somewhat difficult Somewhat difficult Not difficult at all  Not difficult at all     Relevant past medical, surgical, family and social history reviewed and updated as indicated. Interim medical history since our last visit reviewed. Allergies and medications reviewed and updated.  Review of Systems   Constitutional:  Negative for chills and fever.  HENT:  Negative for ear pain, rhinorrhea and sore throat.   Respiratory:  Positive for cough. Negative for shortness of breath.   Cardiovascular:  Negative for chest pain.  Musculoskeletal:  Negative for myalgias.  Neurological:  Negative for headaches.    Per HPI unless specifically indicated above     Objective:    BP 118/69   Pulse 89   Temp 98.9 F (37.2 C) (Oral)   Wt 182 lb 1.6 oz (82.6 kg)   SpO2 98%   BMI 29.39 kg/m   Wt Readings from Last 3 Encounters:  11/06/21 182 lb 1.6 oz (82.6 kg)  06/22/21 185 lb (83.9 kg)  01/01/21 187 lb 12.8 oz (85.2 kg)    Physical Exam Constitutional:      General: She is not in acute distress.    Appearance: Normal appearance. She is well-developed.  HENT:     Head: Normocephalic and atraumatic.  Eyes:     General: Lids are normal. No scleral icterus.       Right eye: No discharge.  Left eye: No discharge.     Conjunctiva/sclera: Conjunctivae normal.  Neck:     Vascular: No carotid bruit or JVD.  Cardiovascular:     Rate and Rhythm: Normal rate and regular rhythm.     Heart sounds: Normal heart sounds.  Pulmonary:     Effort: Pulmonary effort is normal. No respiratory distress.     Breath sounds: Normal breath sounds.  Abdominal:     Palpations: There is no hepatomegaly or splenomegaly.  Musculoskeletal:        General: Normal range of motion.     Cervical back: Normal range of motion and neck supple.  Skin:    General: Skin is warm and dry.     Coloration: Skin is not pale.     Findings: No rash.  Neurological:     Mental Status: She is alert and oriented to person, place, and time.  Psychiatric:        Behavior: Behavior normal.        Thought Content: Thought content normal.        Judgment: Judgment normal.     Results for orders placed or performed in visit on 06/28/20  Comp Met (CMET)  Result Value Ref Range   Glucose 87 65 - 99 mg/dL   BUN 9 6 - 20  mg/dL   Creatinine, Ser 0.70 0.57 - 1.00 mg/dL   eGFR 125 >59 mL/min/1.73   BUN/Creatinine Ratio 13 9 - 23   Sodium 144 134 - 144 mmol/L   Potassium 4.4 3.5 - 5.2 mmol/L   Chloride 106 96 - 106 mmol/L   CO2 22 20 - 29 mmol/L   Calcium 9.7 8.7 - 10.2 mg/dL   Total Protein 7.2 6.0 - 8.5 g/dL   Albumin 4.9 3.9 - 5.0 g/dL   Globulin, Total 2.3 1.5 - 4.5 g/dL   Albumin/Globulin Ratio 2.1 1.2 - 2.2   Bilirubin Total 0.3 0.0 - 1.2 mg/dL   Alkaline Phosphatase 69 44 - 121 IU/L   AST 15 0 - 40 IU/L   ALT 11 0 - 32 IU/L      Assessment & Plan:   Problem List Items Addressed This Visit       Unprioritized   Depression, recurrent ([place])    Doing well.  Continue current medications.        Relevant Medications   buPROPion (WELLBUTRIN XL) 150 MG 24 hr tablet   buPROPion (WELLBUTRIN XL) 300 MG 24 hr tablet   traZODone (DESYREL) 100 MG tablet   Other Visit Diagnoses     Acute cough    -  Primary   Post viral cough.  Will rx albuterol and suggest watching you tube video on how to take.  No signs of bacterial process        Follow up plan: Return if symptoms worsen or fail to improve.

## 2021-11-06 NOTE — Assessment & Plan Note (Signed)
Doing well. Continue current medications.

## 2021-11-28 ENCOUNTER — Other Ambulatory Visit: Payer: Self-pay | Admitting: Unknown Physician Specialty

## 2021-11-28 NOTE — Telephone Encounter (Signed)
Requested Prescriptions  Pending Prescriptions Disp Refills  . albuterol (VENTOLIN HFA) 108 (90 Base) MCG/ACT inhaler [Pharmacy Med Name: ALBUTEROL HFA (PROVENTIL) INH] 6.7 each 2    Sig: TAKE 2 PUFFS BY MOUTH EVERY 6 HOURS AS NEEDED FOR WHEEZE OR SHORTNESS OF BREATH     Pulmonology:  Beta Agonists 2 Passed - 11/28/2021  2:20 AM      Passed - Last BP in normal range    BP Readings from Last 1 Encounters:  11/06/21 118/69         Passed - Last Heart Rate in normal range    Pulse Readings from Last 1 Encounters:  11/06/21 89         Passed - Valid encounter within last 12 months    Recent Outpatient Visits          3 weeks ago Acute cough   [facility] Gabriel Cirri, NP   5 months ago Anxiety   [facility] Larae Grooms, NP   10 months ago Depression, recurrent [facility])   [facility] Larae Grooms, NP   1 year ago Depression, recurrent (HCC)   [facility] Larae Grooms, NP   1 year ago Anxiety   [facility] Valentino Nose, NP

## 2021-12-03 ENCOUNTER — Ambulatory Visit (INDEPENDENT_AMBULATORY_CARE_PROVIDER_SITE_OTHER): Payer: 59 | Admitting: Nurse Practitioner

## 2021-12-03 ENCOUNTER — Encounter: Payer: Self-pay | Admitting: Nurse Practitioner

## 2021-12-03 VITALS — BP 128/79 | HR 76 | Temp 98.8°F | Wt 182.3 lb

## 2021-12-03 DIAGNOSIS — M5442 Lumbago with sciatica, left side: Secondary | ICD-10-CM | POA: Diagnosis not present

## 2021-12-03 DIAGNOSIS — G8929 Other chronic pain: Secondary | ICD-10-CM

## 2021-12-03 MED ORDER — PREDNISONE 10 MG PO TABS: 10.0000 mg | ORAL_TABLET | Freq: Every day | ORAL | 0 refills | Status: DC

## 2021-12-03 NOTE — Progress Notes (Unsigned)
   BP 128/79   Pulse 76   Temp 98.8 F (37.1 C) (Oral)   Wt 182 lb 4.8 oz (82.7 kg)   SpO2 98%   BMI 29.42 kg/m    Subjective:    Patient ID: Brittany Vega, female    DOB: 05-14-1995, 26 y.o.   MRN: [id number]  HPI: Brittany Vega is a 26 y.o. female  Chief Complaint  Patient presents with   Back Pain    L lower back pain ongoing x 1 year. Denies recent fall/injury. Denies urinary symptoms. Movement/bending/twisting will exacerbate pain.    BACK PAIN Duration:  on and off for 1 year Mechanism of injury: no trauma Location: L>R and low back Onset: gradual Severity: 2/10- Friday the pain came out of no where and was a 6-7/10.  Quality: sharp Frequency: intermittent Radiation: L leg above the knee Aggravating factors:  bending or twisting Alleviating factors: laying Status: worse Treatments attempted:  Has seen a chiropractor , rest, ice, and ibuprofen  Relief with NSAIDs?: mild Nighttime pain:  no Paresthesias / decreased sensation:  no Bowel / bladder incontinence:  no Fevers:  no Dysuria / urinary frequency:  no  Relevant past medical, surgical, family and social history reviewed and updated as indicated. Interim medical history since our last visit reviewed. Allergies and medications reviewed and updated.  Review of Systems  Per HPI unless specifically indicated above     Objective:    BP 128/79   Pulse 76   Temp 98.8 F (37.1 C) (Oral)   Wt 182 lb 4.8 oz (82.7 kg)   SpO2 98%   BMI 29.42 kg/m   Wt Readings from Last 3 Encounters:  12/03/21 182 lb 4.8 oz (82.7 kg)  11/06/21 182 lb 1.6 oz (82.6 kg)  06/22/21 185 lb (83.9 kg)    Physical Exam  Results for orders placed or performed in visit on 06/28/20  Comp Met (CMET)  Result Value Ref Range   Glucose 87 65 - 99 mg/dL   BUN 9 6 - 20 mg/dL   Creatinine, Ser 0.70 0.57 - 1.00 mg/dL   eGFR 125 >59 mL/min/1.73   BUN/Creatinine Ratio 13 9 - 23   Sodium 144 134 - 144 mmol/L   Potassium 4.4 3.5 - 5.2  mmol/L   Chloride 106 96 - 106 mmol/L   CO2 22 20 - 29 mmol/L   Calcium 9.7 8.7 - 10.2 mg/dL   Total Protein 7.2 6.0 - 8.5 g/dL   Albumin 4.9 3.9 - 5.0 g/dL   Globulin, Total 2.3 1.5 - 4.5 g/dL   Albumin/Globulin Ratio 2.1 1.2 - 2.2   Bilirubin Total 0.3 0.0 - 1.2 mg/dL   Alkaline Phosphatase 69 44 - 121 IU/L   AST 15 0 - 40 IU/L   ALT 11 0 - 32 IU/L      Assessment & Plan:   Problem List Items Addressed This Visit   None    Follow up plan: No follow-ups on file.

## 2021-12-04 ENCOUNTER — Ambulatory Visit
Admission: RE | Admit: 2021-12-04 | Discharge: 2021-12-04 | Disposition: A | Discharge status: Home / Self Care | Payer: 59 | Attending: Nurse Practitioner | Admitting: Nurse Practitioner

## 2021-12-04 ENCOUNTER — Ambulatory Visit
Admission: RE | Admit: 2021-12-04 | Discharge: 2021-12-04 | Disposition: A | Discharge status: Home / Self Care | Payer: 59 | Source: Ambulatory Visit | Attending: Nurse Practitioner | Admitting: Nurse Practitioner

## 2021-12-04 DIAGNOSIS — G8929 Other chronic pain: Secondary | ICD-10-CM | POA: Insufficient documentation

## 2021-12-04 DIAGNOSIS — M545 Low back pain, unspecified: Secondary | ICD-10-CM | POA: Diagnosis not present

## 2021-12-04 DIAGNOSIS — M5442 Lumbago with sciatica, left side: Secondary | ICD-10-CM | POA: Diagnosis not present

## 2021-12-05 NOTE — Progress Notes (Signed)
Hi Netty.  Your xray was normal.  I recommend doing physical therapy to help with back pain.

## 2022-02-13 DIAGNOSIS — M5451 Vertebrogenic low back pain: Secondary | ICD-10-CM | POA: Diagnosis not present

## 2022-03-05 ENCOUNTER — Other Ambulatory Visit: Payer: Self-pay | Admitting: Unknown Physician Specialty

## 2022-03-06 NOTE — Telephone Encounter (Signed)
Requested medication (s) are due for refill today:   No  Requested medication (s) are on the active medication list:   Yes for all 3  Future visit scheduled:   No   Last ordered: All 3 ordered 11/06/2021 for 90 days with 1 refill   Returned because labs are due per protocol.   Pharmacy requesting a 90 day supply.  Requested Prescriptions  Pending Prescriptions Disp Refills   traZODone (DESYREL) 100 MG tablet [Pharmacy Med Name: TRAZODONE 100 MG TABLET] 90 tablet 2    Sig: TAKE 1/2 TO 1 TABLET BY MOUTH AT BEDTIME AS NEEDED FOR SLEEP     Psychiatry: Antidepressants - Serotonin Modulator Passed - 03/05/2022  1:31 PM      Passed - Completed PHQ-2 or PHQ-9 in the last 360 days      Passed - Valid encounter within last 6 months    Recent Outpatient Visits           3 months ago Chronic left-sided low back pain with left-sided sciatica   [facility] Larae Grooms, NP   4 months ago Acute cough   [facility] Gabriel Cirri, NP   8 months ago Anxiety   [facility] Larae Grooms, NP   1 year ago Depression, recurrent (HCC)   [facility] Larae Grooms, NP   1 year ago Depression, recurrent (HCC)   [facility] Larae Grooms, NP               buPROPion (WELLBUTRIN XL) 300 MG 24 hr tablet [Pharmacy Med Name: BUPROPION HCL XL 300 MG TABLET] 90 tablet 2    Sig: TAKE 1 TABLET BY MOUTH EVERY DAY     Psychiatry: Antidepressants - bupropion Failed - 03/05/2022  1:31 PM      Failed - Cr in normal range and within 360 days    Creatinine, Ser  Date Value Ref Range Status  06/28/2020 0.70 0.57 - 1.00 mg/dL Final         Failed - AST in normal range and within 360 days    AST  Date Value Ref Range Status  06/28/2020 15 0 - 40 IU/L Final         Failed - ALT in normal range and within 360 days    ALT  Date Value Ref Range Status  06/28/2020 11 0 - 32 IU/L Final         Passed - Completed PHQ-2 or  PHQ-9 in the last 360 days      Passed - Last BP in normal range    BP Readings from Last 1 Encounters:  12/03/21 128/79         Passed - Valid encounter within last 6 months    Recent Outpatient Visits           3 months ago Chronic left-sided low back pain with left-sided sciatica   [facility] Larae Grooms, NP   4 months ago Acute cough   [facility] Gabriel Cirri, NP   8 months ago Anxiety   [facility] Larae Grooms, NP   1 year ago Depression, recurrent (HCC)   [facility] Larae Grooms, NP   1 year ago Depression, recurrent (HCC)   [facility] Larae Grooms, NP               buPROPion (WELLBUTRIN XL) 150 MG 24 hr tablet [Pharmacy Med Name: BUPROPION HCL XL 150 MG TABLET] 90  tablet 2    Sig: TAKE 1 TABLET BY MOUTH EVERY DAY     Psychiatry: Antidepressants - bupropion Failed - 03/05/2022  1:31 PM      Failed - Cr in normal range and within 360 days    Creatinine, Ser  Date Value Ref Range Status  06/28/2020 0.70 0.57 - 1.00 mg/dL Final         Failed - AST in normal range and within 360 days    AST  Date Value Ref Range Status  06/28/2020 15 0 - 40 IU/L Final         Failed - ALT in normal range and within 360 days    ALT  Date Value Ref Range Status  06/28/2020 11 0 - 32 IU/L Final         Passed - Completed PHQ-2 or PHQ-9 in the last 360 days      Passed - Last BP in normal range    BP Readings from Last 1 Encounters:  12/03/21 128/79         Passed - Valid encounter within last 6 months    Recent Outpatient Visits           3 months ago Chronic left-sided low back pain with left-sided sciatica   [facility] Larae Grooms, NP   4 months ago Acute cough   [facility] Gabriel Cirri, NP   8 months ago Anxiety   [facility] Larae Grooms, NP   1 year ago Depression, recurrent [facility])   [facility]  Larae Grooms, NP   1 year ago Depression, recurrent [facility])   [facility] Larae Grooms, NP

## 2022-03-20 DIAGNOSIS — M5451 Vertebrogenic low back pain: Secondary | ICD-10-CM | POA: Diagnosis not present

## 2022-03-27 DIAGNOSIS — M5451 Vertebrogenic low back pain: Secondary | ICD-10-CM | POA: Diagnosis not present

## 2022-05-16 ENCOUNTER — Other Ambulatory Visit: Payer: Self-pay | Admitting: Unknown Physician Specialty

## 2022-05-16 NOTE — Telephone Encounter (Signed)
Requested medication (s) are due for refill today: yes  Requested medication (s) are on the active medication list: yes    Last refill: 11/06/21  #90  1 refill  Future visit scheduled no  Notes to clinic:Failed due to labs, please review. Thank you.  Requested Prescriptions  Pending Prescriptions Disp Refills   buPROPion (WELLBUTRIN XL) 300 MG 24 hr tablet [Pharmacy Med Name: BUPROPION HCL XL 300 MG TABLET] 30 tablet 5    Sig: TAKE 1 TABLET BY MOUTH EVERY DAY     Psychiatry: Antidepressants - bupropion Failed - 05/16/2022  1:48 AM      Failed - Cr in normal range and within 360 days    Creatinine, Ser  Date Value Ref Range Status  06/28/2020 0.70 0.57 - 1.00 mg/dL Final         Failed - AST in normal range and within 360 days    AST  Date Value Ref Range Status  06/28/2020 15 0 - 40 IU/L Final         Failed - ALT in normal range and within 360 days    ALT  Date Value Ref Range Status  06/28/2020 11 0 - 32 IU/L Final         Passed - Completed PHQ-2 or PHQ-9 in the last 360 days      Passed - Last BP in normal range    BP Readings from Last 1 Encounters:  12/03/21 128/79         Passed - Valid encounter within last 6 months    Recent Outpatient Visits           5 months ago Chronic left-sided low back pain with left-sided sciatica   [place] Jon Billings, NP   6 months ago Acute cough   [place] Kathrine Haddock, NP   10 months ago [place], Karen, NP   1 year ago Depression, recurrent [facility])   [place] Jon Billings, NP   1 year ago Depression, recurrent ([place])   [place] Jon Billings, NP               buPROPion (WELLBUTRIN XL) 150 MG 24 hr tablet [Pharmacy Med Name: BUPROPION HCL XL 150 MG TABLET] 30 tablet 5    Sig: TAKE 1 TABLET BY MOUTH EVERY DAY     Psychiatry: Antidepressants -  bupropion Failed - 05/16/2022  1:48 AM      Failed - Cr in normal range and within 360 days    Creatinine, Ser  Date Value Ref Range Status  06/28/2020 0.70 0.57 - 1.00 mg/dL Final         Failed - AST in normal range and within 360 days    AST  Date Value Ref Range Status  06/28/2020 15 0 - 40 IU/L Final         Failed - ALT in normal range and within 360 days    ALT  Date Value Ref Range Status  06/28/2020 11 0 - 32 IU/L Final         Passed - Completed PHQ-2 or PHQ-9 in the last 360 days      Passed - Last BP in normal range    BP Readings from Last 1 Encounters:  12/03/21 128/79         Passed - Valid encounter within last 6 months  Recent Outpatient Visits           5 months ago Chronic left-sided low back pain with left-sided sciatica   [place], NP   6 months ago Acute cough   [place] Kathrine Haddock, NP   10 months ago [place], NP   1 year ago Depression, recurrent [facility])   [place] Jon Billings, NP   1 year ago Depression, recurrent [facility])   [place] Jon Billings, NP

## 2022-05-17 NOTE — Telephone Encounter (Signed)
Called patient to schedule an appointment. Unable to leave vm due to full mailbox

## 2022-05-17 NOTE — Telephone Encounter (Signed)
Patient is overdue for a follow up. Please call to schedule.

## 2022-05-20 ENCOUNTER — Encounter: Payer: Self-pay | Admitting: Nurse Practitioner

## 2022-05-20 NOTE — Telephone Encounter (Signed)
2nd attempt to reach patient. Sent letter via Deloris Ping due to voicemail box being full

## 2022-05-20 NOTE — Telephone Encounter (Signed)
Routing to provider for courtesy refill is appropriate.

## 2022-07-11 ENCOUNTER — Ambulatory Visit (INDEPENDENT_AMBULATORY_CARE_PROVIDER_SITE_OTHER): Payer: 59 | Admitting: Nurse Practitioner

## 2022-07-11 ENCOUNTER — Other Ambulatory Visit: Payer: Self-pay | Admitting: Nurse Practitioner

## 2022-07-11 ENCOUNTER — Encounter: Payer: Self-pay | Admitting: Nurse Practitioner

## 2022-07-11 VITALS — BP 110/63 | HR 64 | Temp 98.6°F | Ht 66.7 in | Wt 174.5 lb

## 2022-07-11 DIAGNOSIS — F339 Major depressive disorder, recurrent, unspecified: Secondary | ICD-10-CM | POA: Diagnosis not present

## 2022-07-11 DIAGNOSIS — F419 Anxiety disorder, unspecified: Secondary | ICD-10-CM | POA: Diagnosis not present

## 2022-07-11 DIAGNOSIS — G4700 Insomnia, unspecified: Secondary | ICD-10-CM | POA: Diagnosis not present

## 2022-07-11 NOTE — Progress Notes (Signed)
BP 110/63   Pulse 64   Temp 98.6 F (37 C) (Oral)   Ht 5' 6.7" (1.694 m)   Wt 174 lb 8 oz (79.2 kg)   LMP  (LMP Unknown)   SpO2 98%   BMI 27.58 kg/m    Subjective:    Patient ID: Brittany Vega, female    DOB: [birth year], 27 y.o.   MRN: [id number]  HPI: Brittany Vega is a 27 y.o. female  Chief Complaint  Patient presents with   Depression   Anxiety   MOOD Patient presents to clinic for follow up on her depression and anxiety.  She is still taking Wellbutrin 450mg  daily.  She is also taking the Trazadone at bedtime.  She doesn't use it every night but usually takes 150mg  when she does take it.    Patient states she is getting over a sinus infection.  She was out of work for Monday and half of Tuesday.  She is wondering if she can get a work note.     Relevant past medical, surgical, family and social history reviewed and updated as indicated. Interim medical history since our last visit reviewed. Allergies and medications reviewed and updated.  Review of Systems  Psychiatric/Behavioral:  Positive for dysphoric mood and sleep disturbance. Negative for suicidal ideas. The patient is nervous/anxious.     Per HPI unless specifically indicated above     Objective:    BP 110/63   Pulse 64   Temp 98.6 F (37 C) (Oral)   Ht 5' 6.7" (1.694 m)   Wt 174 lb 8 oz (79.2 kg)   LMP  (LMP Unknown)   SpO2 98%   BMI 27.58 kg/m   Wt Readings from Last 3 Encounters:  07/11/22 174 lb 8 oz (79.2 kg)  12/03/21 182 lb 4.8 oz (82.7 kg)  11/06/21 182 lb 1.6 oz (82.6 kg)    Physical Exam Vitals and nursing note reviewed.  Constitutional:      General: She is not in acute distress.    Appearance: Normal appearance. She is normal weight. She is not ill-appearing, toxic-appearing or diaphoretic.  HENT:     Head: Normocephalic.     Right Ear: External ear normal.     Left Ear: External ear normal.     Nose: Nose normal.     Mouth/Throat:     Mouth: Mucous membranes are moist.      Pharynx: Oropharynx is clear.  Eyes:     General:        Right eye: No discharge.        Left eye: No discharge.     Extraocular Movements: Extraocular movements intact.     Conjunctiva/sclera: Conjunctivae normal.     Pupils: Pupils are equal, round, and reactive to light.  Cardiovascular:     Rate and Rhythm: Normal rate and regular rhythm.     Heart sounds: No murmur heard. Pulmonary:     Effort: Pulmonary effort is normal. No respiratory distress.     Breath sounds: Normal breath sounds. No wheezing or rales.  Musculoskeletal:     Cervical back: Normal range of motion and neck supple.  Skin:    General: Skin is warm and dry.     Capillary Refill: Capillary refill takes less than 2 seconds.  Neurological:     General: No focal deficit present.     Mental Status: She is alert and oriented to person, place, and time. Mental status is at baseline.  Psychiatric:        Mood and Affect: Mood normal.        Behavior: Behavior normal.        Thought Content: Thought content normal.        Judgment: Judgment normal.     Results for orders placed or performed in visit on 06/28/20  Comp Met (CMET)  Result Value Ref Range   Glucose 87 65 - 99 mg/dL   BUN 9 6 - 20 mg/dL   Creatinine, Ser 0.70 0.57 - 1.00 mg/dL   eGFR 125 >59 mL/min/1.73   BUN/Creatinine Ratio 13 9 - 23   Sodium 144 134 - 144 mmol/L   Potassium 4.4 3.5 - 5.2 mmol/L   Chloride 106 96 - 106 mmol/L   CO2 22 20 - 29 mmol/L   Calcium 9.7 8.7 - 10.2 mg/dL   Total Protein 7.2 6.0 - 8.5 g/dL   Albumin 4.9 3.9 - 5.0 g/dL   Globulin, Total 2.3 1.5 - 4.5 g/dL   Albumin/Globulin Ratio 2.1 1.2 - 2.2   Bilirubin Total 0.3 0.0 - 1.2 mg/dL   Alkaline Phosphatase 69 44 - 121 IU/L   AST 15 0 - 40 IU/L   ALT 11 0 - 32 IU/L      Assessment & Plan:   Problem List Items Addressed This Visit       Other   Anxiety - Primary    Chronic.  Controlled.  Continue with current medication regimen of Wellbutrin 450mg  daily.  Will  check labs at next visit.  Return to clinic in 2 months for reevaluation.  Call sooner if concerns arise.        Depression, recurrent    Chronic.  Controlled.  Continue with current medication regimen of Wellbutrin 450mg  daily.  Will check labs at next visit.  Return to clinic in 2 months for reevaluation.  Call sooner if concerns arise.       Insomnia    Chronic.  Controlled.  Continue with current medication regimen of Trazadone 100-150mg  nightly PRN.  Return to clinic in 2 months for reevaluation.  Call sooner if concerns arise.          Follow up plan: Return in about 2 months (around 09/10/2022) for Physical and Fasting labs.

## 2022-07-11 NOTE — Assessment & Plan Note (Signed)
Chronic.  Controlled.  Continue with current medication regimen of Wellbutrin 450mg  daily.  Will check labs at next visit.  Return to clinic in 2 months for reevaluation.  Call sooner if concerns arise.

## 2022-07-11 NOTE — Assessment & Plan Note (Signed)
Chronic.  Controlled.  Continue with current medication regimen of Trazadone 100-150mg  nightly PRN.  Return to clinic in 2 months for reevaluation.  Call sooner if concerns arise.

## 2022-07-11 NOTE — Progress Notes (Deleted)
There were no vitals taken for this visit.   Subjective:    Patient ID: Brittany Vega, female    DOB: [birth year], 27 y.o.   MRN: [id number]  HPI: Brittany Vega is a 27 y.o. female presenting on 07/11/2022 for comprehensive medical examination. Current medical complaints include:{Blank single:19197::"none","***"}  She currently lives with: Menopausal Symptoms: {Blank single:19197::"yes","no"}  Depression Screen done today and results listed below:     12/03/2021    4:19 PM 11/06/2021    1:50 PM 06/22/2021    9:41 AM 01/22/2021    3:20 PM 01/01/2021    1:56 PM  Depression screen PHQ 2/9  Decreased Interest 1 0 1 0 0  Down, Depressed, Hopeless 1 1 1  0 0  PHQ - 2 Score 2 1 2  0 0  Altered sleeping 0 1 1 1    Tired, decreased energy 1 1 1 1    Change in appetite 0 0 0 0   Feeling bad or failure about yourself  0 0 0 0   Trouble concentrating 1 1 1  0   Moving slowly or fidgety/restless 0 0 0 0   Suicidal thoughts 0 0 0 0   PHQ-9 Score 4 4 5 2    Difficult doing work/chores Somewhat difficult Somewhat difficult Somewhat difficult Not difficult at all     The patient {has/does not have:19849} a history of falls. I {did/did not:19850} complete a risk assessment for falls. A plan of care for falls {was/was not:19852} documented.   Past Medical History:  Past Medical History:  Diagnosis Date   Depression     Surgical History:  No past surgical history on file.  Medications:  Current Outpatient Medications on File Prior to Visit  Medication Sig   albuterol (VENTOLIN HFA) 108 (90 Base) MCG/ACT inhaler TAKE 2 PUFFS BY MOUTH EVERY 6 HOURS AS NEEDED FOR WHEEZE OR SHORTNESS OF BREATH   buPROPion (WELLBUTRIN XL) 150 MG 24 hr tablet Take 1 tablet (150 mg total) by mouth daily.   buPROPion (WELLBUTRIN XL) 300 MG 24 hr tablet Take 1 tablet (300 mg total) by mouth daily.   etonogestrel (NEXPLANON) 68 MG IMPL implant 1 each by Subdermal route once. Placed April 2019   ketoconazole (NIZORAL) 2  % shampoo Apply topically 3 (three) times a week.   predniSONE (DELTASONE) 10 MG tablet Take 1 tablet (10 mg total) by mouth daily with breakfast. Take 6 today, 5 tomorrow, and decrease by 1 each day until course is complete.   traZODone (DESYREL) 100 MG tablet TAKE 0.5-1 TABLETS BY MOUTH AT BEDTIME AS NEEDED FOR SLEEP.   No current facility-administered medications on file prior to visit.    Allergies:  Allergies  Allergen Reactions   Rexulti [Brexpiprazole] Other (See Comments)    Dry mouth   Seroquel [Quetiapine Fumarate] Other (See Comments)    Restless legs    Social History:  Social History   Socioeconomic History   Marital status: Single    Spouse name: Not on file   Number of children: Not on file   Years of education: Not on file   Highest education level: Not on file  Occupational History   Not on file  Tobacco Use   Smoking status: Never   Smokeless tobacco: Never  Vaping Use   Vaping Use: Never used  Substance and Sexual Activity   Alcohol use: No   Drug use: No   Sexual activity: Yes    Birth control/protection: Condom  Other Topics Concern  Not on file  Social History Narrative   Not on file   Social Determinants of Health   Financial Resource Strain: Not on file  Food Insecurity: Not on file  Transportation Needs: Not on file  Physical Activity: Not on file  Stress: Not on file  Social Connections: Not on file  Intimate Partner Violence: Not At Risk (01/01/2021)   Humiliation, Afraid, Rape, and Kick questionnaire    Fear of Current or Ex-Partner: No    Emotionally Abused: No    Physically Abused: No    Sexually Abused: No   Social History   Tobacco Use  Smoking Status Never  Smokeless Tobacco Never   Social History   Substance and Sexual Activity  Alcohol Use No    Family History:  Family History  Problem Relation Age of Onset   Hypertension Mother    Congestive Heart Failure Maternal Grandmother    COPD Maternal Grandmother     Fibromyalgia Maternal Grandmother    Diabetes Maternal Grandfather     Past medical history, surgical history, medications, allergies, family history and social history reviewed with patient today and changes made to appropriate areas of the chart.   ROS All other ROS negative except what is listed above and in the HPI.      Objective:    There were no vitals taken for this visit.  Wt Readings from Last 3 Encounters:  12/03/21 182 lb 4.8 oz (82.7 kg)  11/06/21 182 lb 1.6 oz (82.6 kg)  06/22/21 185 lb (83.9 kg)    Physical Exam  Results for orders placed or performed in visit on 06/28/20  Comp Met (CMET)  Result Value Ref Range   Glucose 87 65 - 99 mg/dL   BUN 9 6 - 20 mg/dL   Creatinine, Ser 0.70 0.57 - 1.00 mg/dL   eGFR 125 >59 mL/min/1.73   BUN/Creatinine Ratio 13 9 - 23   Sodium 144 134 - 144 mmol/L   Potassium 4.4 3.5 - 5.2 mmol/L   Chloride 106 96 - 106 mmol/L   CO2 22 20 - 29 mmol/L   Calcium 9.7 8.7 - 10.2 mg/dL   Total Protein 7.2 6.0 - 8.5 g/dL   Albumin 4.9 3.9 - 5.0 g/dL   Globulin, Total 2.3 1.5 - 4.5 g/dL   Albumin/Globulin Ratio 2.1 1.2 - 2.2   Bilirubin Total 0.3 0.0 - 1.2 mg/dL   Alkaline Phosphatase 69 44 - 121 IU/L   AST 15 0 - 40 IU/L   ALT 11 0 - 32 IU/L      Assessment & Plan:   Problem List Items Addressed This Visit   None    Follow up plan: No follow-ups on file.   LABORATORY TESTING:  - Pap smear: {Blank [id number] done","not applicable","up to date","done elsewhere"}  IMMUNIZATIONS:   - Tdap: Tetanus vaccination status reviewed: {tetanus status:315746}. - Influenza: {Blank single:19197::"Up to date","Administered today","Postponed to flu season","Refused","Given elsewhere"} - Pneumovax: {Blank single:19197::"Up to date","Administered today","Not applicable","Refused","Given elsewhere"} - Prevnar: {Blank single:19197::"Up to date","Administered today","Not applicable","Refused","Given elsewhere"} - COVID: {Blank  single:19197::"Up to date","Administered today","Not applicable","Refused","Given elsewhere"} - HPV: {Blank single:19197::"Up to date","Administered today","Not applicable","Refused","Given elsewhere"} - Shingrix vaccine: {Blank single:19197::"Up to date","Administered today","Not applicable","Refused","Given elsewhere"}  SCREENING: -Mammogram: {Blank single:19197::"Up to date","Ordered today","Not applicable","Refused","Done elsewhere"}  - Colonoscopy: {Blank single:19197::"Up to date","Ordered today","Not applicable","Refused","Done elsewhere"}  - Bone Density: {Blank single:19197::"Up to date","Ordered today","Not applicable","Refused","Done elsewhere"}  -Hearing Test: {Blank single:19197::"Up to date","Ordered today","Not applicable","Refused","Done elsewhere"}  -Spirometry: {Blank single:19197::"Up to date","Ordered today","Not applicable","Refused","Done elsewhere"}  PATIENT COUNSELING:   Advised to take 1 mg of folate supplement per day if capable of pregnancy.   Sexuality: Discussed sexually transmitted diseases, partner selection, use of condoms, avoidance of unintended pregnancy  and contraceptive alternatives.   Advised to avoid cigarette smoking.  I discussed with the patient that most people either abstain from alcohol or drink within safe limits (<=14/week and <=4 drinks/occasion for males, <=7/weeks and <= 3 drinks/occasion for females) and that the risk for alcohol disorders and other health effects rises proportionally with the number of drinks per week and how often a drinker exceeds daily limits.  Discussed cessation/primary prevention of drug use and availability of treatment for abuse.   Diet: Encouraged to adjust caloric intake to maintain  or achieve ideal body weight, to reduce intake of dietary saturated fat and total fat, to limit sodium intake by avoiding high sodium foods and not adding table salt, and to maintain adequate dietary potassium and calcium preferably  from fresh fruits, vegetables, and low-fat dairy products.    stressed the importance of regular exercise  Injury prevention: Discussed safety belts, safety helmets, smoke detector, smoking near bedding or upholstery.   Dental health: Discussed importance of regular tooth brushing, flossing, and dental visits.    NEXT PREVENTATIVE PHYSICAL DUE IN 1 YEAR. No follow-ups on file.

## 2022-07-12 NOTE — Telephone Encounter (Signed)
Requested medications are due for refill today.  yes  Requested medications are on the active medications list.  yes  Last refill. 11/06/2021 for both  Future visit scheduled.   yes  Notes to clinic.   Labs are expired.    Requested Prescriptions  Pending Prescriptions Disp Refills   buPROPion (WELLBUTRIN XL) 150 MG 24 hr tablet [Pharmacy Med Name: BUPROPION HCL XL 150 MG TABLET] 30 tablet 5    Sig: TAKE 1 TABLET BY MOUTH EVERY DAY     Psychiatry: Antidepressants - bupropion Failed - 07/11/2022  5:13 PM      Failed - Cr in normal range and within 360 days    Creatinine, Ser  Date Value Ref Range Status  06/28/2020 0.70 0.57 - 1.00 mg/dL Final         Failed - AST in normal range and within 360 days    AST  Date Value Ref Range Status  06/28/2020 15 0 - 40 IU/L Final         Failed - ALT in normal range and within 360 days    ALT  Date Value Ref Range Status  06/28/2020 11 0 - 32 IU/L Final         Passed - Completed PHQ-2 or PHQ-9 in the last 360 days      Passed - Last BP in normal range    BP Readings from Last 1 Encounters:  07/11/22 110/63         Passed - Valid encounter within last 6 months    Recent Outpatient Visits           Yesterday Anxiety   [place] [facility]rooms[facility], Karen, NP   7 months ago Chronic left-sided low back pain with left-sided sciatica   [place] [facility] Grooms[facility]sworth, Karen, NP   8 months ago Acute cough   [place] [facility] Cirri[facility] Cheryl, NP   1 year ago Anxiety   [place] [facility] GroomsHoldsworth, Karen, NP   1 year ago Depression, recurrent [facility]   [place] [facility] Larae GroomsHoldsworth, Karen, NP       Future Appointments             In 2 months Larae GroomsHoldsworth, Karen, NP [place] [facility]             buPROPion (WELLBUTRIN XL) 300 MG 24 hr tablet [Pharmacy Med Name: BUPROPION HCL XL 300 MG TABLET] 30 tablet 5    Sig:  TAKE 1 TABLET BY MOUTH EVERY DAY     Psychiatry: Antidepressants - bupropion Failed - 07/11/2022  5:13 PM      Failed - Cr in normal range and within 360 days    Creatinine, Ser  Date Value Ref Range Status  06/28/2020 0.70 0.57 - 1.00 mg/dL Final         Failed - AST in normal range and within 360 days    AST  Date Value Ref Range Status  06/28/2020 15 0 - 40 IU/L Final         Failed - ALT in normal range and within 360 days    ALT  Date Value Ref Range Status  06/28/2020 11 0 - 32 IU/L Final         Passed - Completed PHQ-2 or PHQ-9 in the last 360 days      Passed - Last BP in normal range    BP Readings from Last  1 Encounters:  07/11/22 110/63         Passed - Valid encounter within last 6 months    Recent Outpatient Visits           Yesterday Anxiety   [place] [facility] Larae Grooms, NP   7 months ago Chronic left-sided low back pain with left-sided sciatica   [place] [facility] Larae Grooms, NP   8 months ago Acute cough   [place] [facility]ri, NP   1 year ago Anxiety   [place] [facility] Larae Grooms, NP   1 year ago Depression, recurrent [facility])   [place] [facility] Larae Grooms, NP       Future Appointments             In 2 months Larae Grooms, NP [place] [facility]

## 2022-08-08 ENCOUNTER — Other Ambulatory Visit: Payer: Self-pay | Admitting: Nurse Practitioner

## 2022-08-09 ENCOUNTER — Ambulatory Visit (INDEPENDENT_AMBULATORY_CARE_PROVIDER_SITE_OTHER): Payer: 59 | Admitting: Nurse Practitioner

## 2022-08-09 ENCOUNTER — Encounter: Payer: Self-pay | Admitting: Nurse Practitioner

## 2022-08-09 ENCOUNTER — Other Ambulatory Visit: Payer: Self-pay | Admitting: Nurse Practitioner

## 2022-08-09 VITALS — BP 105/65 | HR 73 | Temp 98.2°F | Wt 175.0 lb

## 2022-08-09 DIAGNOSIS — L309 Dermatitis, unspecified: Secondary | ICD-10-CM | POA: Diagnosis not present

## 2022-08-09 DIAGNOSIS — B079 Viral wart, unspecified: Secondary | ICD-10-CM | POA: Diagnosis not present

## 2022-08-09 MED ORDER — IMIQUIMOD 2.5 % EX CREA: 1.0000 "application " | TOPICAL_CREAM | CUTANEOUS | 0 refills | Status: AC

## 2022-08-09 MED ORDER — TRIAMCINOLONE ACETONIDE 0.1 % EX OINT: 1.0000 | TOPICAL_OINTMENT | Freq: Two times a day (BID) | CUTANEOUS | 1 refills | Status: AC

## 2022-08-09 MED ORDER — PREDNISONE 10 MG PO TABS: 10.0000 mg | ORAL_TABLET | Freq: Every day | ORAL | 0 refills | Status: DC

## 2022-08-09 NOTE — Telephone Encounter (Signed)
Pt requesting 90 day refills.  Requested Prescriptions  Pending Prescriptions Disp Refills   buPROPion (WELLBUTRIN XL) 300 MG 24 hr tablet [Pharmacy Med Name: BUPROPION HCL XL 300 MG TABLET] 90 tablet 1    Sig: TAKE 1 TABLET BY MOUTH EVERY DAY     Psychiatry: Antidepressants - bupropion Failed - 08/08/2022 11:30 AM      Failed - Cr in normal range and within 360 days    Creatinine, Ser  Date Value Ref Range Status  06/28/2020 0.70 0.57 - 1.00 mg/dL Final         Failed - AST in normal range and within 360 days    AST  Date Value Ref Range Status  06/28/2020 15 0 - 40 IU/L Final         Failed - ALT in normal range and within 360 days    ALT  Date Value Ref Range Status  06/28/2020 11 0 - 32 IU/L Final         Passed - Completed PHQ-2 or PHQ-9 in the last 360 days      Passed - Last BP in normal range    BP Readings from Last 1 Encounters:  07/11/22 110/63         Passed - Valid encounter within last 6 months    Recent Outpatient Visits           4 weeks ago Anxiety   [place] [facility] Larae Grooms, NP   8 months ago Chronic left-sided low back pain with left-sided sciatica   [place] [facility], NP   9 months ago Acute cough   [place] [facility] Gabriel Cirri, NP   1 year ago Anxiety   [place] [facility] Larae Grooms, NP   1 year ago Depression, recurrent [facility])   [facility] Larae Grooms, NP       Future Appointments             Today Larae Grooms, NP [facility]   In 1 month Larae Grooms, NP  [facility]             buPROPion (WELLBUTRIN XL) 150 MG 24 hr tablet [Pharmacy Med Name: BUPROPION HCL XL 150 MG TABLET] 90 tablet 1    Sig: TAKE 1 TABLET BY MOUTH EVERY DAY     Psychiatry: Antidepressants - bupropion Failed - 08/08/2022 11:30 AM      Failed - Cr in  normal range and within 360 days    Creatinine, Ser  Date Value Ref Range Status  06/28/2020 0.70 0.57 - 1.00 mg/dL Final         Failed - AST in normal range and within 360 days    AST  Date Value Ref Range Status  06/28/2020 15 0 - 40 IU/L Final         Failed - ALT in normal range and within 360 days    ALT  Date Value Ref Range Status  06/28/2020 11 0 - 32 IU/L Final         Passed - Completed PHQ-2 or PHQ-9 in the last 360 days      Passed - Last BP in normal range    BP Readings from Last 1 Encounters:  07/11/22 110/63         Passed - Valid encounter within last 6 months    Recent Outpatient Visits  4 weeks ago Anxiety   [place] [facility] Larae Grooms, NP   8 months ago Chronic left-sided low back pain with left-sided sciatica   [place] [facility] Larae Grooms, NP   9 months ago Acute cough   [place] [facility] Gabriel Cirri, NP   1 year ago Anxiety   [place] [facility] Larae Grooms, NP   1 year ago Depression, recurrent [facility])   [place] [facility] Larae Grooms, NP       Future Appointments             Today Larae Grooms, NP [place] [facility], [facility]   In 1 month Larae Grooms, NP  [facility], [facility]

## 2022-08-09 NOTE — Telephone Encounter (Signed)
Requested medication (s) are due for refill today - no  Requested medication (s) are on the active medication list -yes  Future visit scheduled -yes  Last refill: 08/09/22  Notes to clinic: Pharmacy request: Direction clarification   Requested Prescriptions  Pending Prescriptions Disp Refills   predniSONE (DELTASONE) 10 MG tablet [Pharmacy Med Name: PREDNISONE 10 MG TABLET] 42 tablet 0    Sig: TAKE 1 TABLET BY MOUTH DAILY WITH BREAKFAST. TAKE 6 FOR TWO DAYS, 5 FOR TWO DAYS AND DECREASE BY 1 EVERY OTHER DAY UNTIL COURSE IS COMPLETE.     Not Delegated - Endocrinology:  Oral Corticosteroids Failed - 08/09/2022  9:04 AM      Failed - This refill cannot be delegated      Failed - Manual Review: Eye exam for IOP if prolonged treatment      Failed - Glucose (serum) in normal range and within 180 days    Glucose  Date Value Ref Range Status  06/28/2020 87 65 - 99 mg/dL Final         Failed - K in normal range and within 180 days    Potassium  Date Value Ref Range Status  06/28/2020 4.4 3.5 - 5.2 mmol/L Final         Failed - Na in normal range and within 180 days    Sodium  Date Value Ref Range Status  06/28/2020 144 134 - 144 mmol/L Final         Failed - Bone Mineral Density or Dexa Scan completed in the last 2 years      Passed - Last BP in normal range    BP Readings from Last 1 Encounters:  08/09/22 105/65         Passed - Valid encounter within last 6 months    Recent Outpatient Visits           Today Eczema, unspecified type   [place] [facility] Larae Grooms, NP   4 weeks ago Anxiety   [place] [facility] Larae [facility]rooms, NP   8 months ago Chronic left-sided low back pain with left-sided sciatica   [place] [facility] Larae Grooms, NP   9 months ago Acute cough   [place] [facility] Gabriel Cirri, NP   1 year ago Anxiety   [place] [facility] Larae Grooms, NP        Future Appointments             In 1 month Larae Grooms, NP [place] [facility]               Requested Prescriptions  Pending Prescriptions Disp Refills   predniSONE (DELTASONE) 10 MG tablet [Pharmacy Med Name: PREDNISONE 10 MG TABLET] 42 tablet 0    Sig: TAKE 1 TABLET BY MOUTH DAILY WITH BREAKFAST. TAKE 6 FOR TWO DAYS, 5 FOR TWO DAYS AND DECREASE BY 1 EVERY OTHER DAY UNTIL COURSE IS COMPLETE.     Not Delegated - Endocrinology:  Oral Corticosteroids Failed - 08/09/2022  9:04 AM      Failed - This refill cannot be delegated      Failed - Manual Review: Eye exam for IOP if prolonged treatment      Failed - Glucose (serum) in normal range and within 180 days    Glucose  Date Value Ref Range Status  06/28/2020 87 65 - 99 mg/dL Final  Failed - K in normal range and within 180 days    Potassium  Date Value Ref Range Status  06/28/2020 4.4 3.5 - 5.2 mmol/L Final         Failed - Na in normal range and within 180 days    Sodium  Date Value Ref Range Status  06/28/2020 144 134 - 144 mmol/L Final         Failed - Bone Mineral Density or Dexa Scan completed in the last 2 years      Passed - Last BP in normal range    BP Readings from Last 1 Encounters:  08/09/22 105/65         Passed - Valid encounter within last 6 months    Recent Outpatient Visits           Today Eczema, unspecified type   [place] [facility] Larae Grooms, NP   4 weeks ago Anxiety   [place] [facility] Larae G[facility], NP   8 months ago Chronic left-sided low back pain with left-sided sciatica   [place] [facility] Larae Grooms, NP   9 months ago Acute cough   [place] [facility] Gabriel Cirri, NP   1 year ago Anxiety   [place] Larae Grooms, NP       Future Appointments             In 1 month Larae Grooms, NP [place] [facility]

## 2022-08-09 NOTE — Progress Notes (Signed)
BP 105/65   Pulse 73   Temp 98.2 F (36.8 C) (Oral)   Wt 175 lb (79.4 kg)   LMP 07/29/2022 (Approximate)   SpO2 99%   BMI 27.66 kg/m    Subjective:    Patient ID: Brittany Vega, female    DOB: [birth year], 27 y.o.   MRN: [id number]  HPI: Brittany Vega is a 27 y.o. female  Chief Complaint  Patient presents with   Rash    Mostly on the back of her thighs flare up started about a week ago but has had this since age 17   RASH Duration:   has been on and off since she was 67   Location: generalized  Itching: yes Burning: yes Redness: yes Oozing: yes Scaling: no Blisters: yes Painful: yes Fevers: no Change in detergents/soaps/personal care products: no Recent illness: no Recent travel:no History of same: yes Context: worse Alleviating factors: hydrocortisone cream and benadryl Treatments attempted:hydrocortisone cream and benadryl Shortness of breath: no  Throat/tongue swelling: no Myalgias/arthralgias: no  Patient states she has a wart on her right ring finger.    Relevant past medical, surgical, family and social history reviewed and updated as indicated. Interim medical history since our last visit reviewed. Allergies and medications reviewed and updated.  Review of Systems  Skin:  Positive for rash.       Wart on right ring finger    Per HPI unless specifically indicated above     Objective:    BP 105/65   Pulse 73   Temp 98.2 F (36.8 C) (Oral)   Wt 175 lb (79.4 kg)   LMP 07/29/2022 (Approximate)   SpO2 99%   BMI 27.66 kg/m   Wt Readings from Last 3 Encounters:  08/09/22 175 lb (79.4 kg)  07/11/22 174 lb 8 oz (79.2 kg)  12/03/21 182 lb 4.8 oz (82.7 kg)    Physical Exam Vitals and nursing note reviewed.  Constitutional:      General: She is not in acute distress.    Appearance: Normal appearance. She is normal weight. She is not ill-appearing, toxic-appearing or diaphoretic.  HENT:     Head: Normocephalic.     Right Ear: External ear  normal.     Left Ear: External ear normal.     Nose: Nose normal.     Mouth/Throat:     Mouth: Mucous membranes are moist.     Pharynx: Oropharynx is clear.  Eyes:     General:        Right eye: No discharge.        Left eye: No discharge.     Extraocular Movements: Extraocular movements intact.     Conjunctiva/sclera: Conjunctivae normal.     Pupils: Pupils are equal, round, and reactive to light.  Cardiovascular:     Rate and Rhythm: Normal rate and regular rhythm.     Heart sounds: No murmur heard. Pulmonary:     Effort: Pulmonary effort is normal. No respiratory distress.     Breath sounds: Normal breath sounds. No wheezing or rales.  Musculoskeletal:     Cervical back: Normal range of motion and neck supple.  Skin:    General: Skin is warm and dry.     Capillary Refill: Capillary refill takes less than 2 seconds.     Findings: Rash present. Rash is papular.       Neurological:     General: No focal deficit present.     Mental Status: She is  alert and oriented to person, place, and time. Mental status is at baseline.  Psychiatric:        Mood and Affect: Mood normal.        Behavior: Behavior normal.        Thought Content: Thought content normal.        Judgment: Judgment normal.     Results for orders placed or performed in visit on 06/28/20  Comp Met (CMET)  Result Value Ref Range   Glucose 87 65 - 99 mg/dL   BUN 9 6 - 20 mg/dL   Creatinine, Ser 1.61 0.57 - 1.00 mg/dL   eGFR 096 >04 VW/UJW/1.19   BUN/Creatinine Ratio 13 9 - 23   Sodium 144 134 - 144 mmol/L   Potassium 4.4 3.5 - 5.2 mmol/L   Chloride 106 96 - 106 mmol/L   CO2 22 20 - 29 mmol/L   Calcium 9.7 8.7 - 10.2 mg/dL   Total Protein 7.2 6.0 - 8.5 g/dL   Albumin 4.9 3.9 - 5.0 g/dL   Globulin, Total 2.3 1.5 - 4.5 g/dL   Albumin/Globulin Ratio 2.1 1.2 - 2.2   Bilirubin Total 0.3 0.0 - 1.2 mg/dL   Alkaline Phosphatase 69 44 - 121 IU/L   AST 15 0 - 40 IU/L   ALT 11 0 - 32 IU/L      Assessment &  Plan:   Problem List Items Addressed This Visit   None Visit Diagnoses     Eczema, unspecified type    -  Primary   Will treat with prednisone and triamcinalone cream.  Follow up if not improved.   Viral warts, unspecified type       Will treat with imiquimod cream.   Relevant Medications   Imiquimod 2.5 % CREA        Follow up plan: Return if symptoms worsen or fail to improve.

## 2022-08-13 ENCOUNTER — Other Ambulatory Visit: Payer: Self-pay | Admitting: Unknown Physician Specialty

## 2022-08-13 ENCOUNTER — Telehealth: Payer: Self-pay

## 2022-08-13 NOTE — Telephone Encounter (Signed)
PA for Zyclara cream initiated and submitted via Cover My Meds. Key: [id number]

## 2022-08-13 NOTE — Telephone Encounter (Signed)
Requested Prescriptions  Pending Prescriptions Disp Refills   traZODone (DESYREL) 100 MG tablet [Pharmacy Med Name: TRAZODONE 100 MG TABLET] 30 tablet 2    Sig: TAKE 1/2 TO 1 TABLET BY MOUTH AT BEDTIME AS NEEDED FOR SLEEP     Psychiatry: Antidepressants - Serotonin Modulator Passed - 08/13/2022  2:54 AM      Passed - Completed PHQ-2 or PHQ-9 in the last 360 days      Passed - Valid encounter within last 6 months    Recent Outpatient Visits           4 days ago Eczema, unspecified type   [facility] Larae Grooms, NP   1 month ago Anxiety   [place] [facility] Larae Grooms, NP   8 months ago Chronic left-sided low back pain with left-sided sciatica   [facility] Larae Grooms, NP   9 months ago Acute cough   [place] [facility] Gabriel Cirri, NP   1 year ago Anxiety   [place] [facility] Larae Grooms, NP       Future Appointments             In 1 month Larae Grooms, NP [place] [facility]

## 2022-08-27 ENCOUNTER — Encounter: Payer: Self-pay | Admitting: Nurse Practitioner

## 2022-08-29 ENCOUNTER — Other Ambulatory Visit (HOSPITAL_COMMUNITY)
Admission: RE | Admit: 2022-08-29 | Discharge: 2022-08-29 | Disposition: A | Discharge status: Home / Self Care | Payer: 59 | Source: Ambulatory Visit | Attending: Nurse Practitioner | Admitting: Nurse Practitioner

## 2022-08-29 ENCOUNTER — Ambulatory Visit (INDEPENDENT_AMBULATORY_CARE_PROVIDER_SITE_OTHER): Payer: 59 | Admitting: Nurse Practitioner

## 2022-08-29 ENCOUNTER — Encounter: Payer: Self-pay | Admitting: Nurse Practitioner

## 2022-08-29 VITALS — BP 124/80 | HR 76 | Temp 98.3°F | Ht 64.96 in | Wt 174.0 lb

## 2022-08-29 DIAGNOSIS — F339 Major depressive disorder, recurrent, unspecified: Secondary | ICD-10-CM

## 2022-08-29 DIAGNOSIS — Z0000 Encounter for general adult medical examination without abnormal findings: Secondary | ICD-10-CM | POA: Insufficient documentation

## 2022-08-29 DIAGNOSIS — Z6828 Body mass index (BMI) 28.0-28.9, adult: Secondary | ICD-10-CM | POA: Diagnosis not present

## 2022-08-29 DIAGNOSIS — F419 Anxiety disorder, unspecified: Secondary | ICD-10-CM

## 2022-08-29 DIAGNOSIS — Z136 Encounter for screening for cardiovascular disorders: Secondary | ICD-10-CM

## 2022-08-29 DIAGNOSIS — G4700 Insomnia, unspecified: Secondary | ICD-10-CM | POA: Diagnosis not present

## 2022-08-29 LAB — URINALYSIS, ROUTINE W REFLEX MICROSCOPIC
Bilirubin, UA: NEGATIVE
Glucose, UA: NEGATIVE
Ketones, UA: NEGATIVE
Leukocytes,UA: NEGATIVE
Nitrite, UA: NEGATIVE
Protein,UA: NEGATIVE
RBC, UA: NEGATIVE
Specific Gravity, UA: 1.02 (ref 1.005–1.030)
Urobilinogen, Ur: 0.2 mg/dL (ref 0.2–1.0)
pH, UA: 6 (ref 5.0–7.5)

## 2022-08-29 MED ORDER — BUPROPION HCL ER (XL) 300 MG PO TB24: 300.0000 mg | ORAL_TABLET | Freq: Every day | ORAL | 1 refills | Status: AC

## 2022-08-29 MED ORDER — TRAZODONE HCL 100 MG PO TABS: ORAL_TABLET | ORAL | 2 refills | Status: AC

## 2022-08-29 NOTE — Progress Notes (Signed)
BP 124/80   Pulse 76   Temp 98.3 F (36.8 C) (Oral)   Ht 5' 4.96" (1.65 m)   Wt 174 lb (78.9 kg)   LMP 08/27/2022 (Approximate)   SpO2 99%   BMI 28.99 kg/m    Subjective:    Patient ID: Brittany Vega, female    DOB: [birth year], 27 y.o.   MRN: [id number]  HPI: Brittany Vega is a 27 y.o. female presenting on 08/29/2022 for comprehensive medical examination. Current medical complaints include: weight loss  She currently lives with: Menopausal Symptoms: no  MOOD Patient states she is taking the Wellbutrin 300mg  daily.  Feels like it is working well for her.  Denies concerns at visit today.  Trazodone is still working well for her sleep.    Depression Screen done today and results listed below:     08/29/2022    2:15 PM 08/09/2022    8:32 AM 07/11/2022    3:09 PM 12/03/2021    4:19 PM 11/06/2021    1:50 PM  Depression screen PHQ 2/9  Decreased Interest 1 0 1 1 0  Down, Depressed, Hopeless 0 0 1 1 1   PHQ - 2 Score 1 0 2 2 1   Altered sleeping 1 1 1  0 1  Tired, decreased energy 1 1 2 1 1   Change in appetite 0 0 0 0 0  Feeling bad or failure about yourself  0 0 0 0 0  Trouble concentrating 1 1 1 1 1   Moving slowly or fidgety/restless 0 0 0 0 0  Suicidal thoughts 0 0 0 0 0  PHQ-9 Score 4 3 6 4 4   Difficult doing work/chores Somewhat difficult Somewhat difficult Somewhat difficult Somewhat difficult Somewhat difficult    The patient does not have a history of falls. I did complete a risk assessment for falls. A plan of care for falls was documented.   Past Medical History:  Past Medical History:  Diagnosis Date   Depression     Surgical History:  History reviewed. No pertinent surgical history.  Medications:  Current Outpatient Medications on File Prior to Visit  Medication Sig   etonogestrel (NEXPLANON) 68 MG IMPL implant 1 each by Subdermal route once. Placed April 2019 (Patient not taking: Reported on 08/29/2022)   Imiquimod 2.5 % CREA Apply 1 application  topically 3  (three) times a week. (Patient not taking: Reported on 08/29/2022)   triamcinolone ointment (KENALOG) 0.1 % Apply 1 Application topically 2 (two) times daily. (Patient not taking: Reported on 08/29/2022)   No current facility-administered medications on file prior to visit.    Allergies:  Allergies  Allergen Reactions   Rexulti [Brexpiprazole] Other (See Comments)    Dry mouth   Seroquel [Quetiapine Fumarate] Other (See Comments)    Restless legs    Social History:  Social History   Socioeconomic History   Marital status: Single    Spouse name: Not on file   Number of children: Not on file   Years of education: Not on file   Highest education level: Not on file  Occupational History   Not on file  Tobacco Use   Smoking status: Never   Smokeless tobacco: Never  Vaping Use   Vaping Use: Never used  Substance and Sexual Activity   Alcohol use: No   Drug use: No   Sexual activity: Not Currently    Birth control/protection: Condom  Other Topics Concern   Not on file  Social History Narrative  Not on file   Social Determinants of Health   Financial Resource Strain: Not on file  Food Insecurity: Not on file  Transportation Needs: Not on file  Physical Activity: Not on file  Stress: Not on file  Social Connections: Not on file  Intimate Partner Violence: Not At Risk (01/01/2021)   Humiliation, Afraid, Rape, and Kick questionnaire    Fear of Current or Ex-Partner: No    Emotionally Abused: No    Physically Abused: No    Sexually Abused: No   Social History   Tobacco Use  Smoking Status Never  Smokeless Tobacco Never   Social History   Substance and Sexual Activity  Alcohol Use No    Family History:  Family History  Problem Relation Age of Onset   Hypertension Mother    Congestive Heart Failure Maternal Grandmother    COPD Maternal Grandmother    Fibromyalgia Maternal Grandmother    Diabetes Maternal Grandfather     Past medical history, surgical  history, medications, allergies, family history and social history reviewed with patient today and changes made to appropriate areas of the chart.   Review of Systems  Psychiatric/Behavioral:  Positive for depression. Negative for suicidal ideas. The patient is nervous/anxious.    All other ROS negative except what is listed above and in the HPI.      Objective:    BP 124/80   Pulse 76   Temp 98.3 F (36.8 C) (Oral)   Ht 5' 4.96" (1.65 m)   Wt 174 lb (78.9 kg)   LMP 08/27/2022 (Approximate)   SpO2 99%   BMI 28.99 kg/m   Wt Readings from Last 3 Encounters:  08/29/22 174 lb (78.9 kg)  08/09/22 175 lb (79.4 kg)  07/11/22 174 lb 8 oz (79.2 kg)    Physical Exam Vitals and nursing note reviewed. Exam conducted with a chaperone present Wilhemena Durie, CMA).  Constitutional:      General: She is awake. She is not in acute distress.    Appearance: Normal appearance. She is well-developed. She is not ill-appearing.  HENT:     Head: Normocephalic and atraumatic.     Right Ear: Hearing, tympanic membrane, ear canal and external ear normal. No drainage.     Left Ear: Hearing, tympanic membrane, ear canal and external ear normal. No drainage.     Nose: Nose normal.     Right Sinus: No maxillary sinus tenderness or frontal sinus tenderness.     Left Sinus: No maxillary sinus tenderness or frontal sinus tenderness.     Mouth/Throat:     Mouth: Mucous membranes are moist.     Pharynx: Oropharynx is clear. Uvula midline. No pharyngeal swelling, oropharyngeal exudate or posterior oropharyngeal erythema.  Eyes:     General: Lids are normal.        Right eye: No discharge.        Left eye: No discharge.     Extraocular Movements: Extraocular movements intact.     Conjunctiva/sclera: Conjunctivae normal.     Pupils: Pupils are equal, round, and reactive to light.     Visual Fields: Right eye visual fields normal and left eye visual fields normal.  Neck:     Thyroid: No thyromegaly.      Vascular: No carotid bruit.     Trachea: Trachea normal.  Cardiovascular:     Rate and Rhythm: Normal rate and regular rhythm.     Heart sounds: Normal heart sounds. No murmur heard.  No gallop.  Pulmonary:     Effort: Pulmonary effort is normal. No accessory muscle usage or respiratory distress.     Breath sounds: Normal breath sounds.  Chest:  Breasts:    Right: Normal.     Left: Normal.  Abdominal:     General: Bowel sounds are normal.     Palpations: Abdomen is soft. There is no hepatomegaly or splenomegaly.     Tenderness: There is no abdominal tenderness.  Genitourinary:    Vagina: Normal.     Cervix: Normal.     Adnexa: Right adnexa normal and left adnexa normal.  Musculoskeletal:        General: Normal range of motion.     Cervical back: Normal range of motion and neck supple.     Right lower leg: No edema.     Left lower leg: No edema.  Lymphadenopathy:     Head:     Right side of head: No submental, submandibular, tonsillar, preauricular or posterior auricular adenopathy.     Left side of head: No submental, submandibular, tonsillar, preauricular or posterior auricular adenopathy.     Cervical: No cervical adenopathy.     Upper Body:     Right upper body: No supraclavicular, axillary or pectoral adenopathy.     Left upper body: No supraclavicular, axillary or pectoral adenopathy.  Skin:    General: Skin is warm and dry.     Capillary Refill: Capillary refill takes less than 2 seconds.     Findings: No rash.  Neurological:     Mental Status: She is alert and oriented to person, place, and time.     Gait: Gait is intact.  Psychiatric:        Attention and Perception: Attention normal.        Mood and Affect: Mood normal.        Speech: Speech normal.        Behavior: Behavior normal. Behavior is cooperative.        Thought Content: Thought content normal.        Judgment: Judgment normal.     Results for orders placed or performed in visit on 06/28/20   Comp Met (CMET)  Result Value Ref Range   Glucose 87 65 - 99 mg/dL   BUN 9 6 - 20 mg/dL   Creatinine, Ser 1.61 0.57 - 1.00 mg/dL   eGFR 096 >04 VW/UJW/1.19   BUN/Creatinine Ratio 13 9 - 23   Sodium 144 134 - 144 mmol/L   Potassium 4.4 3.5 - 5.2 mmol/L   Chloride 106 96 - 106 mmol/L   CO2 22 20 - 29 mmol/L   Calcium 9.7 8.7 - 10.2 mg/dL   Total Protein 7.2 6.0 - 8.5 g/dL   Albumin 4.9 3.9 - 5.0 g/dL   Globulin, Total 2.3 1.5 - 4.5 g/dL   Albumin/Globulin Ratio 2.1 1.2 - 2.2   Bilirubin Total 0.3 0.0 - 1.2 mg/dL   Alkaline Phosphatase 69 44 - 121 IU/L   AST 15 0 - 40 IU/L   ALT 11 0 - 32 IU/L      Assessment & Plan:   Problem List Items Addressed This Visit       Other   Anxiety    Chronic.  Controlled.  Continue with current medication regimen of Wellbutrin 300mg  daily.  Labs ordered today.  Return to clinic in 6 months for reevaluation.  Call sooner if concerns arise.       Relevant Medications  buPROPion (WELLBUTRIN XL) 300 MG 24 hr tablet   traZODone (DESYREL) 100 MG tablet   Depression, recurrent (HCC)    Chronic.  Controlled.  Continue with current medication regimen of Wellbutrin 300mg  daily.  Labs ordered today.  Return to clinic in 6 months for reevaluation.  Call sooner if concerns arise.        Relevant Medications   buPROPion (WELLBUTRIN XL) 300 MG 24 hr tablet   traZODone (DESYREL) 100 MG tablet   Insomnia    Chronic.  Controlled.  Continue with current medication regimen of Trazodone.  Labs ordered today.  Return to clinic in 6 months for reevaluation.  Call sooner if concerns arise.        Other Visit Diagnoses     Annual physical exam    -  Primary   Health maintenance reviewed during visit today.  Labs ordered today.  Vaccines up to date.  PAP done.   Relevant Orders   CBC with Differential/Platelet   Comprehensive metabolic panel   Lipid panel   TSH   Urinalysis, Routine w reflex microscopic   Cytology - PAP   Screening for ischemic heart  disease       Relevant Orders   Lipid panel   Body mass index (BMI) 28.0-28.9, adult       Relevant Orders   Amb Ref to Medical Weight Management        Follow up plan: Return in about 6 months (around 03/01/2023) for Depression/Anxiety FU.   LABORATORY TESTING:  - Pap smear: done today  IMMUNIZATIONS:   - Tdap: Tetanus vaccination status reviewed: last tetanus booster within 10 years. - Influenza: Postponed till flu season - Pneumovax: NA - Prevnar:NA - COVID: Up to date - Shingrix vaccine: NA  SCREENING: -Mammogram: NA - Colonoscopy: NA  - Bone Density: NA  -Hearing Test: NA  -Spirometry: NA   PATIENT COUNSELING:   Advised to take 1 mg of folate supplement per day if capable of pregnancy.   Sexuality: Discussed sexually transmitted diseases, partner selection, use of condoms, avoidance of unintended pregnancy  and contraceptive alternatives.   Advised to avoid cigarette smoking.  I discussed with the patient that most people either abstain from alcohol or drink within safe limits (<=14/week and <=4 drinks/occasion for males, <=7/weeks and <= 3 drinks/occasion for females) and that the risk for alcohol disorders and other health effects rises proportionally with the number of drinks per week and how often a drinker exceeds daily limits.  Discussed cessation/primary prevention of drug use and availability of treatment for abuse.   Diet: Encouraged to adjust caloric intake to maintain  or achieve ideal body weight, to reduce intake of dietary saturated fat and total fat, to limit sodium intake by avoiding high sodium foods and not adding table salt, and to maintain adequate dietary potassium and calcium preferably from fresh fruits, vegetables, and low-fat dairy products.    stressed the importance of regular exercise  Injury prevention: Discussed safety belts, safety helmets, smoke detector, smoking near bedding or upholstery.   Dental health: Discussed importance of  regular tooth brushing, flossing, and dental visits.    NEXT PREVENTATIVE PHYSICAL DUE IN 1 YEAR. Return in about 6 months (around 03/01/2023) for Depression/Anxiety FU.

## 2022-08-29 NOTE — Assessment & Plan Note (Signed)
Chronic.  Controlled.  Continue with current medication regimen of Wellbutrin 300mg daily.  Labs ordered today.  Return to clinic in 6 months for reevaluation.  Call sooner if concerns arise.   

## 2022-08-29 NOTE — Assessment & Plan Note (Signed)
Chronic.  Controlled.  Continue with current medication regimen of Trazodone.  Labs ordered today.  Return to clinic in 6 months for reevaluation.  Call sooner if concerns arise.

## 2022-08-30 LAB — COMPREHENSIVE METABOLIC PANEL WITH GFR
ALT: 12 IU/L (ref 0–32)
AST: 17 IU/L (ref 0–40)
Albumin/Globulin Ratio: 2 (ref 1.2–2.2)
Albumin: 5 g/dL (ref 4.0–5.0)
Alkaline Phosphatase: 58 IU/L (ref 44–121)
BUN/Creatinine Ratio: 14 (ref 9–23)
BUN: 10 mg/dL (ref 6–20)
Bilirubin Total: 0.4 mg/dL (ref 0.0–1.2)
CO2: 23 mmol/L (ref 20–29)
Calcium: 9.8 mg/dL (ref 8.7–10.2)
Chloride: 103 mmol/L (ref 96–106)
Creatinine, Ser: 0.72 mg/dL (ref 0.57–1.00)
Globulin, Total: 2.5 g/dL (ref 1.5–4.5)
Glucose: 85 mg/dL (ref 70–99)
Potassium: 3.9 mmol/L (ref 3.5–5.2)
Sodium: 142 mmol/L (ref 134–144)
Total Protein: 7.5 g/dL (ref 6.0–8.5)
eGFR: 119 mL/min/1.73

## 2022-08-30 LAB — CBC WITH DIFFERENTIAL/PLATELET
Basophils Absolute: 0 x10E3/uL (ref 0.0–0.2)
Basos: 1 %
EOS (ABSOLUTE): 0.1 x10E3/uL (ref 0.0–0.4)
Eos: 1 %
Hematocrit: 44.6 % (ref 34.0–46.6)
Hemoglobin: 14.9 g/dL (ref 11.1–15.9)
Immature Grans (Abs): 0 x10E3/uL (ref 0.0–0.1)
Immature Granulocytes: 0 %
Lymphocytes Absolute: 1.9 x10E3/uL (ref 0.7–3.1)
Lymphs: 30 %
MCH: 27.8 pg (ref 26.6–33.0)
MCHC: 33.4 g/dL (ref 31.5–35.7)
MCV: 83 fL (ref 79–97)
Monocytes Absolute: 0.4 x10E3/uL (ref 0.1–0.9)
Monocytes: 7 %
Neutrophils Absolute: 4 x10E3/uL (ref 1.4–7.0)
Neutrophils: 61 %
Platelets: 285 x10E3/uL (ref 150–450)
RBC: 5.36 x10E6/uL — ABNORMAL HIGH (ref 3.77–5.28)
RDW: 12.3 % (ref 11.7–15.4)
WBC: 6.4 x10E3/uL (ref 3.4–10.8)

## 2022-08-30 LAB — TSH: TSH: 0.951 u[IU]/mL (ref 0.450–4.500)

## 2022-08-30 LAB — LIPID PANEL
Chol/HDL Ratio: 2.7 ratio (ref 0.0–4.4)
Cholesterol, Total: 178 mg/dL (ref 100–199)
HDL: 67 mg/dL (ref >39)
LDL Chol Calc (NIH): 97 mg/dL (ref 0–99)
Triglycerides: 74 mg/dL (ref 0–149)
VLDL Cholesterol Cal: 14 mg/dL (ref 5–40)

## 2022-08-30 NOTE — Progress Notes (Signed)
HI Brittany Vega. It was nice to see you yesterday.  Your lab work looks good.  No concerns at this time. Continue with your current medication regimen.  Follow up as discussed.  Please let me know if you have any questions.

## 2022-09-06 LAB — CYTOLOGY - PAP: Diagnosis: NEGATIVE

## 2022-09-09 NOTE — Progress Notes (Signed)
Hi [name].  Your PAP was negative.  We will repeat it in 3 years.

## 2022-09-12 ENCOUNTER — Encounter: Payer: 59 | Admitting: Nurse Practitioner

## 2023-03-03 ENCOUNTER — Ambulatory Visit: Payer: 59 | Admitting: Nurse Practitioner

## 2023-03-03 NOTE — Progress Notes (Deleted)
There were no vitals taken for this visit.   Subjective:    Patient ID: Brittany Vega, female    DOB: 1996-01-08, 27 y.o.   MRN: [id number]  HPI: Brittany Vega is a 27 y.o. female  No chief complaint on file.  MOOD Patient states she is taking the Wellbutrin 300mg  daily.  Feels like it is working well for her.  Denies concerns at visit today.  Trazodone is still working well for her sleep.   Relevant past medical, surgical, family and social history reviewed and updated as indicated. Interim medical history since our last visit reviewed. Allergies and medications reviewed and updated.  Review of Systems  Per HPI unless specifically indicated above     Objective:    There were no vitals taken for this visit.  Wt Readings from Last 3 Encounters:  08/29/22 174 lb (78.9 kg)  08/09/22 175 lb (79.4 kg)  07/11/22 174 lb 8 oz (79.2 kg)    Physical Exam  Results for orders placed or performed in visit on 08/29/22  CBC with Differential/Platelet  Result Value Ref Range   WBC 6.4 3.4 - 10.8 x10E3/uL   RBC 5.36 (H) 3.77 - 5.28 x10E6/uL   Hemoglobin 14.9 11.1 - 15.9 g/dL   Hematocrit 24.4 01.0 - 46.6 %   MCV 83 79 - 97 fL   MCH 27.8 26.6 - 33.0 pg   MCHC 33.4 31.5 - 35.7 g/dL   RDW 27.2 53.6 - 64.4 %   Platelets 285 150 - 450 x10E3/uL   Neutrophils 61 Not Estab. %   Lymphs 30 Not Estab. %   Monocytes 7 Not Estab. %   Eos 1 Not Estab. %   Basos 1 Not Estab. %   Neutrophils Absolute 4.0 1.4 - 7.0 x10E3/uL   Lymphocytes Absolute 1.9 0.7 - 3.1 x10E3/uL   Monocytes Absolute 0.4 0.1 - 0.9 x10E3/uL   EOS (ABSOLUTE) 0.1 0.0 - 0.4 x10E3/uL   Basophils Absolute 0.0 0.0 - 0.2 x10E3/uL   Immature Granulocytes 0 Not Estab. %   Immature Grans (Abs) 0.0 0.0 - 0.1 x10E3/uL  Comprehensive metabolic panel  Result Value Ref Range   Glucose 85 70 - 99 mg/dL   BUN 10 6 - 20 mg/dL   Creatinine, Ser 0.34 0.57 - 1.00 mg/dL   eGFR 742 >59 DG/LOV/5.64   BUN/Creatinine Ratio 14 9 - 23    Sodium 142 134 - 144 mmol/L   Potassium 3.9 3.5 - 5.2 mmol/L   Chloride 103 96 - 106 mmol/L   CO2 23 20 - 29 mmol/L   Calcium 9.8 8.7 - 10.2 mg/dL   Total Protein 7.5 6.0 - 8.5 g/dL   Albumin 5.0 4.0 - 5.0 g/dL   Globulin, Total 2.5 1.5 - 4.5 g/dL   Albumin/Globulin Ratio 2.0 1.2 - 2.2   Bilirubin Total 0.4 0.0 - 1.2 mg/dL   Alkaline Phosphatase 58 44 - 121 IU/L   AST 17 0 - 40 IU/L   ALT 12 0 - 32 IU/L  Lipid panel  Result Value Ref Range   Cholesterol, Total 178 100 - 199 mg/dL   Triglycerides 74 0 - 149 mg/dL   HDL 67 >33 mg/dL   VLDL Cholesterol Cal 14 5 - 40 mg/dL   LDL Chol Calc (NIH) 97 0 - 99 mg/dL   Chol/HDL Ratio 2.7 0.0 - 4.4 ratio  TSH  Result Value Ref Range   TSH 0.951 0.450 - 4.500 uIU/mL  Urinalysis, Routine w reflex  microscopic  Result Value Ref Range   Specific Gravity, UA 1.020 1.005 - 1.030   pH, UA 6.0 5.0 - 7.5   Color, UA Yellow Yellow   Appearance Ur Cloudy (A) Clear   Leukocytes,UA Negative Negative   Protein,UA Negative Negative/Trace   Glucose, UA Negative Negative   Ketones, UA Negative Negative   RBC, UA Negative Negative   Bilirubin, UA Negative Negative   Urobilinogen, Ur 0.2 0.2 - 1.0 mg/dL   Nitrite, UA Negative Negative   Microscopic Examination Comment   Cytology - PAP  Result Value Ref Range   Adequacy      Satisfactory for evaluation; transformation zone component PRESENT.   Diagnosis      - Negative for intraepithelial lesion or malignancy (NILM)      Assessment & Plan:   Problem List Items Addressed This Visit       Other   Anxiety   Depression, recurrent (HCC) - Primary     Follow up plan: No follow-ups on file.

## 2023-05-07 ENCOUNTER — Encounter (INDEPENDENT_AMBULATORY_CARE_PROVIDER_SITE_OTHER): Payer: Self-pay

## 2023-05-26 ENCOUNTER — Encounter (INDEPENDENT_AMBULATORY_CARE_PROVIDER_SITE_OTHER): Payer: Self-pay
